# Patient Record
Sex: Female | Born: 1980 | Race: Black or African American | Hispanic: No | Marital: Single | Smoking: Current some day smoker
Health system: Southern US, Community
[De-identification: ages and names within clinical notes are randomized; demographics above are authoritative.]

## PROBLEM LIST (undated history)

## (undated) HISTORY — PX: OTHER SURGICAL HISTORY: SHX169

## (undated) HISTORY — PX: WISDOM TOOTH EXTRACTION: SHX21

---

## 1998-02-16 ENCOUNTER — Emergency Department (HOSPITAL_COMMUNITY): Admission: EM | Admit: 1998-02-16 | Discharge: 1998-02-16 | Payer: Self-pay | Admitting: Emergency Medicine

## 1999-01-24 ENCOUNTER — Emergency Department (HOSPITAL_COMMUNITY): Admission: EM | Admit: 1999-01-24 | Discharge: 1999-01-24 | Payer: Self-pay | Admitting: Emergency Medicine

## 1999-11-25 ENCOUNTER — Ambulatory Visit (HOSPITAL_COMMUNITY): Admission: RE | Admit: 1999-11-25 | Discharge: 1999-11-25 | Payer: Self-pay | Admitting: Dentistry

## 1999-11-25 ENCOUNTER — Encounter: Payer: Self-pay | Admitting: Dentistry

## 2000-04-10 ENCOUNTER — Encounter: Payer: Self-pay | Admitting: Emergency Medicine

## 2000-04-10 ENCOUNTER — Emergency Department (HOSPITAL_COMMUNITY): Admission: EM | Admit: 2000-04-10 | Discharge: 2000-04-10 | Payer: Self-pay | Admitting: Emergency Medicine

## 2000-05-04 ENCOUNTER — Emergency Department (HOSPITAL_COMMUNITY): Admission: EM | Admit: 2000-05-04 | Discharge: 2000-05-04 | Payer: Self-pay | Admitting: Emergency Medicine

## 2000-06-02 ENCOUNTER — Encounter: Payer: Self-pay | Admitting: Nephrology

## 2000-06-02 ENCOUNTER — Encounter: Admission: RE | Admit: 2000-06-02 | Discharge: 2000-06-02 | Payer: Self-pay | Admitting: Nephrology

## 2000-06-04 ENCOUNTER — Encounter: Payer: Self-pay | Admitting: Nephrology

## 2000-06-04 ENCOUNTER — Encounter: Admission: RE | Admit: 2000-06-04 | Discharge: 2000-06-04 | Payer: Self-pay | Admitting: Nephrology

## 2001-04-12 ENCOUNTER — Other Ambulatory Visit: Admission: RE | Admit: 2001-04-12 | Discharge: 2001-04-12 | Payer: Self-pay | Admitting: Obstetrics and Gynecology

## 2001-09-28 ENCOUNTER — Emergency Department (HOSPITAL_COMMUNITY): Admission: EM | Admit: 2001-09-28 | Discharge: 2001-09-28 | Payer: Self-pay | Admitting: Emergency Medicine

## 2002-06-08 ENCOUNTER — Other Ambulatory Visit: Admission: RE | Admit: 2002-06-08 | Discharge: 2002-06-08 | Payer: Self-pay | Admitting: Obstetrics and Gynecology

## 2002-09-22 ENCOUNTER — Emergency Department (HOSPITAL_COMMUNITY): Admission: EM | Admit: 2002-09-22 | Discharge: 2002-09-23 | Payer: Self-pay | Admitting: *Deleted

## 2002-12-03 ENCOUNTER — Inpatient Hospital Stay (HOSPITAL_COMMUNITY): Admission: AD | Admit: 2002-12-03 | Discharge: 2002-12-03 | Payer: Self-pay | Admitting: Obstetrics and Gynecology

## 2003-04-17 ENCOUNTER — Inpatient Hospital Stay (HOSPITAL_COMMUNITY): Admission: AD | Admit: 2003-04-17 | Discharge: 2003-04-17 | Payer: Self-pay | Admitting: Obstetrics and Gynecology

## 2003-06-11 ENCOUNTER — Other Ambulatory Visit: Admission: RE | Admit: 2003-06-11 | Discharge: 2003-06-11 | Payer: Self-pay | Admitting: Obstetrics and Gynecology

## 2004-06-08 ENCOUNTER — Inpatient Hospital Stay (HOSPITAL_COMMUNITY): Admission: AD | Admit: 2004-06-08 | Discharge: 2004-06-08 | Payer: Self-pay | Admitting: Obstetrics and Gynecology

## 2004-06-24 ENCOUNTER — Other Ambulatory Visit: Admission: RE | Admit: 2004-06-24 | Discharge: 2004-06-24 | Payer: Self-pay | Admitting: Obstetrics and Gynecology

## 2005-02-12 ENCOUNTER — Ambulatory Visit: Payer: Self-pay | Admitting: Internal Medicine

## 2005-09-15 ENCOUNTER — Other Ambulatory Visit: Admission: RE | Admit: 2005-09-15 | Discharge: 2005-09-15 | Payer: Self-pay | Admitting: Obstetrics and Gynecology

## 2006-06-08 ENCOUNTER — Ambulatory Visit (HOSPITAL_COMMUNITY): Admission: RE | Admit: 2006-06-08 | Discharge: 2006-06-08 | Payer: Self-pay | Admitting: Family Medicine

## 2007-05-10 ENCOUNTER — Ambulatory Visit (HOSPITAL_BASED_OUTPATIENT_CLINIC_OR_DEPARTMENT_OTHER): Admission: RE | Admit: 2007-05-10 | Discharge: 2007-05-10 | Payer: Self-pay | Admitting: General Surgery

## 2007-05-10 ENCOUNTER — Encounter (HOSPITAL_BASED_OUTPATIENT_CLINIC_OR_DEPARTMENT_OTHER): Payer: Self-pay | Admitting: General Surgery

## 2008-02-15 ENCOUNTER — Encounter: Admission: RE | Admit: 2008-02-15 | Discharge: 2008-02-15 | Payer: Self-pay | Admitting: General Practice

## 2008-02-23 ENCOUNTER — Ambulatory Visit (HOSPITAL_COMMUNITY): Admission: RE | Admit: 2008-02-23 | Discharge: 2008-02-23 | Payer: Self-pay | Admitting: Chiropractic Medicine

## 2008-02-24 ENCOUNTER — Encounter: Admission: RE | Admit: 2008-02-24 | Discharge: 2008-02-24 | Payer: Self-pay | Admitting: Chiropractic Medicine

## 2008-03-19 ENCOUNTER — Emergency Department (HOSPITAL_COMMUNITY): Admission: EM | Admit: 2008-03-19 | Discharge: 2008-03-19 | Payer: Self-pay | Admitting: Emergency Medicine

## 2008-08-30 ENCOUNTER — Emergency Department (HOSPITAL_COMMUNITY): Admission: EM | Admit: 2008-08-30 | Discharge: 2008-08-30 | Payer: Self-pay | Admitting: Emergency Medicine

## 2009-03-18 ENCOUNTER — Ambulatory Visit (HOSPITAL_COMMUNITY): Admission: RE | Admit: 2009-03-18 | Discharge: 2009-03-18 | Payer: Self-pay | Admitting: Obstetrics & Gynecology

## 2009-06-12 ENCOUNTER — Observation Stay (HOSPITAL_COMMUNITY): Admission: AD | Admit: 2009-06-12 | Discharge: 2009-06-13 | Payer: Self-pay | Admitting: Obstetrics and Gynecology

## 2009-06-12 ENCOUNTER — Ambulatory Visit: Payer: Self-pay | Admitting: Family Medicine

## 2009-07-28 ENCOUNTER — Inpatient Hospital Stay (HOSPITAL_COMMUNITY): Admission: AD | Admit: 2009-07-28 | Discharge: 2009-07-31 | Payer: Self-pay | Admitting: Obstetrics & Gynecology

## 2009-07-28 ENCOUNTER — Ambulatory Visit: Payer: Self-pay | Admitting: Obstetrics & Gynecology

## 2009-11-10 ENCOUNTER — Emergency Department (HOSPITAL_COMMUNITY): Admission: EM | Admit: 2009-11-10 | Discharge: 2009-11-10 | Payer: Self-pay | Admitting: Emergency Medicine
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
## 2010-06-04 ENCOUNTER — Emergency Department (HOSPITAL_COMMUNITY): Admission: EM | Admit: 2010-06-04 | Discharge: 2010-06-04 | Payer: Self-pay | Admitting: Emergency Medicine

## 2010-06-18 ENCOUNTER — Inpatient Hospital Stay (HOSPITAL_COMMUNITY): Admission: AD | Admit: 2010-06-18 | Discharge: 2010-06-18 | Payer: Self-pay | Admitting: Obstetrics and Gynecology

## 2010-08-10 HISTORY — PX: TUBAL LIGATION: SHX77

## 2010-10-21 ENCOUNTER — Inpatient Hospital Stay (HOSPITAL_COMMUNITY)
Admission: AD | Admit: 2010-10-21 | Discharge: 2010-10-21 | Disposition: A | Discharge status: Home / Self Care | Payer: Medicaid Other | Source: Ambulatory Visit | Attending: Obstetrics and Gynecology | Admitting: Obstetrics and Gynecology

## 2010-10-21 DIAGNOSIS — Z2989 Encounter for other specified prophylactic measures: Secondary | ICD-10-CM | POA: Insufficient documentation

## 2010-10-21 DIAGNOSIS — Z298 Encounter for other specified prophylactic measures: Secondary | ICD-10-CM | POA: Insufficient documentation

## 2010-10-21 DIAGNOSIS — Z3480 Encounter for supervision of other normal pregnancy, unspecified trimester: Secondary | ICD-10-CM | POA: Insufficient documentation

## 2010-10-21 LAB — RH IMMUNE GLOBULIN WORKUP (NOT WOMEN'S HOSP)

## 2010-10-22 LAB — RH IMMUNE GLOBULIN WORKUP (NOT WOMEN'S HOSP)
ABO/RH(D): A NEG
Antibody Screen: NEGATIVE
Unit division: 0

## 2010-11-10 LAB — CBC
HCT: 28.4 % — ABNORMAL LOW (ref 36.0–46.0)
HCT: 33.8 % — ABNORMAL LOW (ref 36.0–46.0)
Hemoglobin: 11.3 g/dL — ABNORMAL LOW (ref 12.0–15.0)
Hemoglobin: 9.6 g/dL — ABNORMAL LOW (ref 12.0–15.0)
MCHC: 33.7 g/dL (ref 30.0–36.0)
MCV: 98.9 fL (ref 78.0–100.0)
RBC: 2.87 MIL/uL — ABNORMAL LOW (ref 3.87–5.11)
RBC: 3.43 MIL/uL — ABNORMAL LOW (ref 3.87–5.11)
RDW: 13.2 % (ref 11.5–15.5)

## 2010-11-10 LAB — RH IMMUNE GLOB WKUP(>/=20WKS)(NOT WOMEN'S HOSP)

## 2010-11-12 LAB — BASIC METABOLIC PANEL
BUN: 3 mg/dL — ABNORMAL LOW (ref 6–23)
CO2: 24 mEq/L (ref 19–32)
Calcium: 8.5 mg/dL (ref 8.4–10.5)
Chloride: 111 mEq/L (ref 96–112)
Creatinine, Ser: 0.55 mg/dL (ref 0.4–1.2)
GFR calc Af Amer: >60 mL/min (ref >60)

## 2010-11-12 LAB — COMPREHENSIVE METABOLIC PANEL
ALT: 21 U/L (ref 0–35)
AST: 26 U/L (ref 0–37)
Alkaline Phosphatase: 119 U/L — ABNORMAL HIGH (ref 39–117)
CO2: 22 mEq/L (ref 19–32)
GFR calc Af Amer: >60 mL/min (ref >60)
GFR calc non Af Amer: >60 mL/min (ref >60)
Glucose, Bld: 105 mg/dL — ABNORMAL HIGH (ref 70–99)
Potassium: 3.3 mEq/L — ABNORMAL LOW (ref 3.5–5.1)
Sodium: 134 mEq/L — ABNORMAL LOW (ref 135–145)

## 2010-11-12 LAB — DIFFERENTIAL
Basophils Relative: 1 % (ref 0–1)
Eosinophils Absolute: 0.3 10*3/uL (ref 0.0–0.7)
Eosinophils Relative: 3 % (ref 0–5)
Monocytes Relative: 6 % (ref 3–12)
Neutrophils Relative %: 73 % (ref 43–77)

## 2010-11-12 LAB — URINALYSIS, ROUTINE W REFLEX MICROSCOPIC
Glucose, UA: NEGATIVE mg/dL
Hgb urine dipstick: NEGATIVE
Ketones, ur: 15 mg/dL — AB
Protein, ur: NEGATIVE mg/dL
Urobilinogen, UA: 0.2 mg/dL (ref 0.0–1.0)

## 2010-11-12 LAB — CBC
Hemoglobin: 9.3 g/dL — ABNORMAL LOW (ref 12.0–15.0)
RBC: 2.77 MIL/uL — ABNORMAL LOW (ref 3.87–5.11)
WBC: 10.8 10*3/uL — ABNORMAL HIGH (ref 4.0–10.5)

## 2010-12-23 NOTE — Op Note (Signed)
NAME:  KIMBERL, VIG NO.:  [id number]   MEDICAL RECORD NO.:  [id number]          PATIENT TYPE:  AMB   LOCATION:  DSC                          FACILITY:  [facility]   PHYSICIAN:  Leonie Man, M.D.   DATE OF BIRTH:  24-May-1981   DATE OF PROCEDURE:  05/10/2007  DATE OF DISCHARGE:                               OPERATIVE REPORT   PREOPERATIVE DIAGNOSIS:  Anal polyp and hemorrhoids.   POSTOPERATIVE DIAGNOSIS:  Anal polyp and hemorrhoids.   PROCEDURE:  Excision of the anal polyp and hemorrhoidectomy.   SURGEON:  Leonie Man, M.D.   ASSISTANT:  OR nurse.   ANESTHESIA:  General.   SPECIMENS TO LAB:  Polyp with hemorrhoids.   ESTIMATED BLOOD LOSS:  Minimal.   COMPLICATIONS:  None.  The patient returned to the PACU in excellent  condition.   NOTE:  The patient is a 30 year old female presenting with an anal  polyp.  It has been increasing in size over the past 3-4 years and the  patient thought at first it was a hemorrhoid but it did not respond to  the usual nonsurgical therapies.  This was seen recently by her  gynecologist and noted to be a polyp and thus she was then referred for  surgical treatment.  The patient comes to the operating room now after  risks and potential benefits of surgery had been discussed.  All  questions answered and consent obtained.   PROCEDURE:  The patient is positioned in the lithotomy position.  Following induction of satisfactory general anesthesia, the perianal  tissues were prepped and draped to be included in a sterile operative  field.  Positive identification of the patient carried out.  The patient  is Virginia Perez and the procedure excision of anal polyp and  hemorrhoidectomy.  The perianal tissues are then infiltrated with the  0.25% Marcaine with epinephrine and anal dilatation carried out to two  fingerbreadths.  The operating anoscope was introduced into the anus.  There was noted a prolapse hemorrhoidal complex  along with a polyp on  the hemorrhoid itself.  This was excised using electrocautery by  carrying an incision from the dentate line down across the mucocutaneous  junction on both sides in an ellipse.  This was dissected free from off  of the external sphincter and removed in its entirety.  Hemostasis was  obtained with electrocautery.  Specimen was forwarded for pathologic  evaluation.  The mucosa and anoderm closed with a running suture of 2-0  chromic catgut.  All areas dissection checked for hemostasis, noted to  be dry.  I placed a  small Gelfoam soaked sponge in Marcaine over the incision line and left  that in place.  Sterile dressing was applied.  The anesthetic reversed  and the patient removed from the operating room to the recovery room in  stable condition.  She tolerated the procedure well.      Leonie Man, M.D.  Electronically Signed     PB/MEDQ  D:  05/10/2007  T:  05/10/2007  Job:  [id number]   cc:   Reuben Likes, M.D.  Elmira J. Powell, P.A.

## 2011-01-11 ENCOUNTER — Inpatient Hospital Stay (HOSPITAL_COMMUNITY)
Admission: AD | Admit: 2011-01-11 | Discharge: 2011-01-13 | DRG: 767 | Disposition: A | Discharge status: Home / Self Care | Payer: 59 | Source: Ambulatory Visit | Attending: Obstetrics and Gynecology | Admitting: Obstetrics and Gynecology

## 2011-01-11 DIAGNOSIS — O34219 Maternal care for unspecified type scar from previous cesarean delivery: Secondary | ICD-10-CM | POA: Diagnosis present

## 2011-01-11 DIAGNOSIS — Z302 Encounter for sterilization: Secondary | ICD-10-CM

## 2011-01-11 LAB — CBC
HCT: 33.1 % — ABNORMAL LOW (ref 36.0–46.0)
MCH: 32.8 pg (ref 26.0–34.0)
MCHC: 34.4 g/dL (ref 30.0–36.0)
MCV: 95.1 fL (ref 78.0–100.0)
Platelets: 173 10*3/uL (ref 150–400)
RDW: 13.3 % (ref 11.5–15.5)
WBC: 11.6 10*3/uL — ABNORMAL HIGH (ref 4.0–10.5)

## 2011-01-12 ENCOUNTER — Other Ambulatory Visit: Payer: Self-pay | Admitting: Obstetrics and Gynecology

## 2011-01-12 LAB — CBC
MCH: 32.4 pg (ref 26.0–34.0)
MCHC: 34 g/dL (ref 30.0–36.0)
MCV: 95.2 fL (ref 78.0–100.0)
Platelets: 166 10*3/uL (ref 150–400)
RBC: 3.12 MIL/uL — ABNORMAL LOW (ref 3.87–5.11)
RDW: 13.3 % (ref 11.5–15.5)

## 2011-01-14 LAB — RH IMMUNE GLOB WKUP(>/=20WKS)(NOT WOMEN'S HOSP)

## 2011-01-15 NOTE — Op Note (Signed)
  NAME:  Virginia Perez, HOOSER NO.:  [id number]  MEDICAL RECORD NO.:  [id number]  LOCATION:  9143                          FACILITY:  [facility]  PHYSICIAN:  Crist Fat. Kathelyn Gombos, M.D. DATE OF BIRTH:  [birth year]  DATE OF PROCEDURE:  01/12/2011 DATE OF DISCHARGE:                              OPERATIVE REPORT   PREOPERATIVE DIAGNOSIS:  Desire for sterilization.  POSTOPERATIVE DIAGNOSIS:  Desire for sterilization.  ANESTHESIA:  Epidural, Dr. Cristela Blue.  PROCEDURE:  Postpartum bilateral tubal ligation.  SURGEON:  Crist Fat. Avalina Benko, MD  ASSISTANT:  Elmira J. Lowell Guitar, PA  ESTIMATED BLOOD LOSS:  Minimal.  PROCEDURE IN DETAIL:  After being informed of the planned procedure with possible complications including bleeding, infection, injury to other organs, irreversibility, and failure rate of 1/500, informed consent was obtained.  The patient was taken to OR #7, given epidural anesthesia with the previously placed epidural catheter without complication.  She was placed in the dorsal decubitus position, prepped and draped in a sterile fashion, and a Foley catheter was inserted in her bladder. After assessing adequate level of anesthesia, we infiltrated the umbilical area with 10 mL of Marcaine 0.25 and performed a semi- elliptical incision which was brought down sharply to the fascia.  The fascia was grasped with 2 Kocher forceps, incised with Mayo scissors, and peritoneum was entered bluntly.  We locate the left tube initially and gravid with a Babcock forceps, exteriorized the fimbriae to confirm tube.  We then at the isthmic-ampullary area, opened the mesosalpinx with cauterization, doubly ligated the proximal stump with 0-chromic, doubly ligated the distal stump with 0-chromic, removed 1.5 cm of tube and cauterized each stump.  Hemostasis was adequate.  We proceeded in the exact same fashion on the right.  Hemostasis was also adequate.  We then closed the fascia with a running  suture of 0-Vicryl and the skin with a subcuticular suture of 4-0 Monocryl and Dermabond.  Instrument and sponge count was complete x2.  Estimated blood loss was minimal.  The procedure was well tolerated by the patient who was taken to the recovery room in a well and stable condition.  SPECIMEN:  Two segments of tubes sent to pathology.     Crist Fat Jayleen Scaglione, M.D.     SAR/MEDQ  D:  01/12/2011  T:  01/13/2011  Job:  [id number]  Electronically Signed by Silverio Lay M.D. on 01/15/2011 11:26:35 PM

## 2011-01-21 ENCOUNTER — Inpatient Hospital Stay (HOSPITAL_COMMUNITY)
Admission: AD | Admit: 2011-01-21 | Discharge: 2011-01-21 | Disposition: A | Discharge status: Home / Self Care | Payer: 59 | Source: Ambulatory Visit | Attending: Obstetrics and Gynecology | Admitting: Obstetrics and Gynecology

## 2011-01-21 DIAGNOSIS — O135 Gestational [pregnancy-induced] hypertension without significant proteinuria, complicating the puerperium: Secondary | ICD-10-CM | POA: Insufficient documentation

## 2011-01-21 LAB — COMPREHENSIVE METABOLIC PANEL
AST: 18 U/L (ref 0–37)
Albumin: 2.7 g/dL — ABNORMAL LOW (ref 3.5–5.2)
Alkaline Phosphatase: 103 U/L (ref 39–117)
BUN: 12 mg/dL (ref 6–23)
CO2: 22 mEq/L (ref 19–32)
Chloride: 104 mEq/L (ref 96–112)
Creatinine, Ser: 0.75 mg/dL (ref 0.4–1.2)
GFR calc non Af Amer: >60 mL/min (ref >60)
Potassium: 3.6 mEq/L (ref 3.5–5.1)
Total Bilirubin: 0.2 mg/dL — ABNORMAL LOW (ref 0.3–1.2)

## 2011-01-21 LAB — CBC
HCT: 29.8 % — ABNORMAL LOW (ref 36.0–46.0)
MCV: 94.3 fL (ref 78.0–100.0)
RBC: 3.16 MIL/uL — ABNORMAL LOW (ref 3.87–5.11)
RDW: 12.6 % (ref 11.5–15.5)
WBC: 5.4 10*3/uL (ref 4.0–10.5)

## 2011-01-21 LAB — URINALYSIS, ROUTINE W REFLEX MICROSCOPIC
Bilirubin Urine: NEGATIVE
Glucose, UA: NEGATIVE mg/dL
Protein, ur: NEGATIVE mg/dL
Specific Gravity, Urine: 1.01 (ref 1.005–1.030)

## 2011-01-21 LAB — LACTATE DEHYDROGENASE: LDH: 180 U/L (ref 94–250)

## 2011-01-21 LAB — URINE MICROSCOPIC-ADD ON: WBC, UA: NONE SEEN WBC/hpf (ref <3)

## 2011-05-08 LAB — DIFFERENTIAL
Eosinophils Relative: 2
Lymphocytes Relative: 34
Lymphs Abs: 1.6
Monocytes Absolute: 0.3
Monocytes Relative: 7

## 2011-05-08 LAB — COMPREHENSIVE METABOLIC PANEL
BUN: 5 — ABNORMAL LOW
Calcium: 9.9
Glucose, Bld: 94
Total Protein: 7.8

## 2011-05-08 LAB — CBC
HCT: 38.9
Hemoglobin: 13.1
RBC: 4.1
WBC: 4.6

## 2011-05-08 LAB — URINALYSIS, ROUTINE W REFLEX MICROSCOPIC
Glucose, UA: NEGATIVE
Ketones, ur: 15 — AB
pH: 6

## 2011-05-08 LAB — POCT PREGNANCY, URINE: Preg Test, Ur: NEGATIVE

## 2011-05-08 LAB — HCG, SERUM, QUALITATIVE: Preg, Serum: NEGATIVE

## 2011-05-08 LAB — LIPASE, BLOOD: Lipase: 18

## 2011-12-18 ENCOUNTER — Encounter (HOSPITAL_COMMUNITY): Payer: Self-pay | Admitting: *Deleted

## 2011-12-18 ENCOUNTER — Emergency Department (HOSPITAL_COMMUNITY)
Admission: EM | Admit: 2011-12-18 | Discharge: 2011-12-18 | Disposition: A | Discharge status: Home / Self Care | Payer: BC Managed Care – PPO | Attending: Emergency Medicine | Admitting: Emergency Medicine

## 2011-12-18 DIAGNOSIS — M7989 Other specified soft tissue disorders: Secondary | ICD-10-CM | POA: Insufficient documentation

## 2011-12-18 DIAGNOSIS — M5430 Sciatica, unspecified side: Secondary | ICD-10-CM | POA: Insufficient documentation

## 2011-12-18 DIAGNOSIS — M549 Dorsalgia, unspecified: Secondary | ICD-10-CM | POA: Insufficient documentation

## 2011-12-18 MED ORDER — CYCLOBENZAPRINE HCL 10 MG PO TABS: 10.0000 mg | ORAL_TABLET | Freq: Two times a day (BID) | ORAL | Status: AC | PRN

## 2011-12-18 MED ORDER — IBUPROFEN 800 MG PO TABS
800.0000 mg | ORAL_TABLET | Freq: Once | ORAL | Status: AC
  Administered 2011-12-18: 800 mg via ORAL
  Filled 2011-12-18: qty 1

## 2011-12-18 MED ORDER — OXYCODONE-ACETAMINOPHEN 5-325 MG PO TABS: 2.0000 | ORAL_TABLET | ORAL | Status: AC | PRN

## 2011-12-18 NOTE — ED Notes (Signed)
Pt reports left hip pain- denies injury- reports during pregnancy it flared up- Pt reports pain comes and goes and increases when stationary.  Good cms-  Radiating pain to LLE and upper back

## 2011-12-18 NOTE — ED Provider Notes (Signed)
History     CSN: [id number]  Arrival date & time 12/18/11  2027   First MD Initiated Contact with Patient 12/18/11 2142      Chief Complaint  Patient presents with  . Hip Pain    (Consider location/radiation/quality/duration/timing/severity/associated sxs/prior treatment) Patient is a 31 y.o. female presenting with hip pain. The history is provided by the patient. No language interpreter was used.  Hip Pain This is a chronic problem. The current episode started more than 1 year ago. The problem occurs intermittently. The problem has been gradually worsening. Associated symptoms include arthralgias. Pertinent negatives include no chills, fever, joint swelling, nausea, numbness, vomiting or weakness. The symptoms are aggravated by walking. She has tried NSAIDs for the symptoms.   patient reports ongoing chronic back pain and sciatica since her child was born several years ago. Denies any bowel or bladder incontinence. Denies any fever. Denies any good range of motion to the knee he left lower extremity. States that the pain has come and gone over the years and she has seen a orthopedic doctor in the past.  History reviewed. No pertinent past medical history.  Past Surgical History  Procedure Date  . Wisdom tooth extraction     History reviewed. No pertinent family history.  History  Substance Use Topics  . Smoking status: Never Smoker   . Smokeless tobacco: Not on file  . Alcohol Use: No    OB History    Grav Para Term Preterm Abortions TAB SAB Ect Mult Living                  Review of Systems  Constitutional: Negative.  Negative for fever and chills.  HENT: Negative.   Eyes: Negative.   Respiratory: Negative.   Cardiovascular: Positive for leg swelling.  Gastrointestinal: Negative.  Negative for nausea and vomiting.  Genitourinary: Negative for dysuria, urgency, frequency, hematuria, flank pain, decreased urine volume, vaginal discharge, enuresis and difficulty  urinating.  Musculoskeletal: Positive for back pain and arthralgias. Negative for joint swelling and gait problem.       L buttocks pain with radiation to LLE posteriorly  Neurological: Negative.  Negative for dizziness, weakness, light-headedness and numbness.  Psychiatric/Behavioral: Negative.   All other systems reviewed and are negative.    Allergies  Prednisone  Home Medications   Current Outpatient Rx  Name Route Sig Dispense Refill  . ACETAMINOPHEN 500 MG PO TABS Oral Take 500 mg by mouth every 6 (six) hours as needed. For pain relief    . OMEGA-3 FATTY ACIDS 1000 MG PO CAPS Oral Take 2 g by mouth daily.    . IBUPROFEN 400 MG PO TABS Oral Take 400 mg by mouth every 6 (six) hours as needed. For pain relief    . ADULT MULTIVITAMIN W/MINERALS CH Oral Take 1 tablet by mouth daily.      BP 130/79  Pulse 86  Temp(Src) 98.6 F (37 C) (Oral)  Resp 18  SpO2 100%  LMP 12/01/2011  Physical Exam  Nursing note and vitals reviewed. Constitutional: She is oriented to person, place, and time. She appears well-developed and well-nourished.  HENT:  Head: Normocephalic and atraumatic.  Eyes: Conjunctivae and EOM are normal. Pupils are equal, round, and reactive to light.  Neck: Normal range of motion. Neck supple.  Cardiovascular: Normal rate.   Pulmonary/Chest: Effort normal.  Abdominal: Soft.  Musculoskeletal: Normal range of motion. She exhibits tenderness.       Tenderness to L buttocks and posterior LLE  Neurological: She is alert and oriented to person, place, and time. She has normal reflexes.  Skin: Skin is warm and dry.  Psychiatric: She has a normal mood and affect.    ED Course  Procedures (including critical care time)  Labs Reviewed - No data to display No results found.   No diagnosis found.    MDM  LL back pain with sciatica that is chronic.  Will treat with ibuprofen, ice and percocet.  Allergic to prednisone.  Follow up with ortho if not better next  week. Neuro in tact.         Remi Haggard, NP 12/19/11 2101

## 2011-12-18 NOTE — ED Notes (Signed)
Pt c/o ongoing L hip pain x 3 months. Pt has small children and has hx of L sciata per her report.

## 2011-12-18 NOTE — Discharge Instructions (Signed)
Ms Bryden left hip pain and are describing sounds like you have sciatica going down into your left lower extremity. Take 800 of ibuprofen every 6 hours with food x24 hours and use ice to the area. Take Percocet for severe pain but do not drive with this medication. Return to the ER if he incontinence and bowel or bladder or weakness or numbness to your lower extremity.  Back Pain, Adult Back pain is very common. The pain often gets better over time. The cause of back pain is usually not dangerous. Most people can learn to manage their back pain on their own.  HOME CARE   Stay active. Start with short walks on flat ground if you can. Try to walk farther each day.   Do not sit, drive, or stand in one place for more than 30 minutes. Do not stay in bed.   Do not avoid exercise or work. Activity can help your back heal faster.   Be careful when you bend or lift an object. Bend at your knees, keep the object close to you, and do not twist.   Sleep on a firm mattress. Lie on your side, and bend your knees. If you lie on your back, put a pillow under your knees.   Only take medicines as told by your doctor.   Put ice on the injured area.   Put ice in a plastic bag.   Place a towel between your skin and the bag.   Leave the ice on for 15 to 20 minutes, 3 to 4 times a day for the first 2 to 3 days. After that, you can switch between ice and heat packs.   Ask your doctor about back exercises or massage.   Avoid feeling anxious or stressed. Find good ways to deal with stress, such as exercise.  GET HELP RIGHT AWAY IF:   Your pain does not go away with rest or medicine.   Your pain does not go away in 1 week.   You have new problems.   You do not feel well.   The pain spreads into your legs.   You cannot control when you poop (bowel movement) or pee (urinate).   Your arms or legs feel weak or lose feeling (numbness).   You feel sick to your stomach (nauseous) or throw up (vomit).    You have belly (abdominal) pain.   You feel like you may pass out (faint).  MAKE SURE YOU:   Understand these instructions.   Will watch your condition.   Will get help right away if you are not doing well or get worse.  Document Released: 01/13/2008 Document Revised: 07/16/2011 Document Reviewed: 12/15/2010 [facility] Patient Information 2012 [place], [place].Back Pain, Adult Back pain is very common. The pain often gets better over time. The cause of back pain is usually not dangerous. Most people can learn to manage their back pain on their own.  HOME CARE   Stay active. Start with short walks on flat ground if you can. Try to walk farther each day.   Do not sit, drive, or stand in one place for more than 30 minutes. Do not stay in bed.   Do not avoid exercise or work. Activity can help your back heal faster.   Be careful when you bend or lift an object. Bend at your knees, keep the object close to you, and do not twist.   Sleep on a firm mattress. Lie on your side, and bend your  knees. If you lie on your back, put a pillow under your knees.   Only take medicines as told by your doctor.   Put ice on the injured area.   Put ice in a plastic bag.   Place a towel between your skin and the bag.   Leave the ice on for 15 to 20 minutes, 3 to 4 times a day for the first 2 to 3 days. After that, you can switch between ice and heat packs.   Ask your doctor about back exercises or massage.   Avoid feeling anxious or stressed. Find good ways to deal with stress, such as exercise.  GET HELP RIGHT AWAY IF:   Your pain does not go away with rest or medicine.   Your pain does not go away in 1 week.   You have new problems.   You do not feel well.   The pain spreads into your legs.   You cannot control when you poop (bowel movement) or pee (urinate).   Your arms or legs feel weak or lose feeling (numbness).   You feel sick to your stomach (nauseous) or throw up (vomit).    You have belly (abdominal) pain.   You feel like you may pass out (faint).  MAKE SURE YOU:   Understand these instructions.   Will watch your condition.   Will get help right away if you are not doing well or get worse.  Document Released: 01/13/2008 Document Revised: 07/16/2011 Document Reviewed: 12/15/2010 [facility] Patient Information 2012 [place], [place].Back Pain, Adult Back pain is very common. The pain often gets better over time. The cause of back pain is usually not dangerous. Most people can learn to manage their back pain on their own.  HOME CARE   Stay active. Start with short walks on flat ground if you can. Try to walk farther each day.   Do not sit, drive, or stand in one place for more than 30 minutes. Do not stay in bed.   Do not avoid exercise or work. Activity can help your back heal faster.   Be careful when you bend or lift an object. Bend at your knees, keep the object close to you, and do not twist.   Sleep on a firm mattress. Lie on your side, and bend your knees. If you lie on your back, put a pillow under your knees.   Only take medicines as told by your doctor.   Put ice on the injured area.   Put ice in a plastic bag.   Place a towel between your skin and the bag.   Leave the ice on for 15 to 20 minutes, 3 to 4 times a day for the first 2 to 3 days. After that, you can switch between ice and heat packs.   Ask your doctor about back exercises or massage.   Avoid feeling anxious or stressed. Find good ways to deal with stress, such as exercise.  GET HELP RIGHT AWAY IF:   Your pain does not go away with rest or medicine.   Your pain does not go away in 1 week.   You have new problems.   You do not feel well.   The pain spreads into your legs.   You cannot control when you poop (bowel movement) or pee (urinate).   Your arms or legs feel weak or lose feeling (numbness).   You feel sick to your stomach (nauseous) or throw up (vomit).    You  have belly (abdominal) pain.   You feel like you may pass out (faint).  MAKE SURE YOU:   Understand these instructions.   Will watch your condition.   Will get help right away if you are not doing well or get worse.  Document Released: 01/13/2008 Document Revised: 07/16/2011 Document Reviewed: 12/15/2010 [facility] Patient Information 2012 [place], [place].Back Exercises Back exercises help treat and prevent back injuries. The goal is to increase your strength in your belly (abdominal) and back muscles. These exercises can also help with flexibility. Start these exercises when told by your doctor. HOME CARE Back exercises include: Pelvic Tilt.  Lie on your back with your knees bent. Tilt your pelvis until the lower part of your back is against the floor. Hold this position 5 to 10 sec. Repeat this exercise 5 to 10 times.  Knee to Chest.  Pull 1 knee up against your chest and hold for 20 to 30 seconds. Repeat this with the other knee. This may be done with the other leg straight or bent, whichever feels better. Then, pull both knees up against your chest.  Sit-Ups or Curl-Ups.  Bend your knees 90 degrees. Start with tilting your pelvis, and do a partial, slow sit-up. Only lift your upper half 30 to 45 degrees off the floor. Take at least 2 to 3 seonds for each sit-up. Do not do sit-ups with your knees out straight. If partial sit-ups are difficult, simply do the above but with only tightening your belly (abdominal) muscles and holding it as told.  Hip-Lift.  Lie on your back with your knees flexed 90 degrees. Push down with your feet and shoulders as you raise your hips 2 inches off the floor. Hold for 10 seconds, repeat 5 to 10 times.  Back Arches.  Lie on your stomach. Prop yourself up on bent elbows. Slowly press on your hands, causing an arch in your low back. Repeat 3 to 5 times.  Shoulder-Lifts.  Lie face down with arms beside your body. Keep hips and belly pressed to floor  as you slowly lift your head and shoulders off the floor.  Do not overdo your exercises. Be careful in the beginning. Exercises may cause you some mild back discomfort. If the pain lasts for more than 15 minutes, stop the exercises until you see your doctor. Improvement with exercise for back problems is slow.  Document Released: 08/29/2010 Document Revised: 07/16/2011 Document Reviewed: 08/29/2010 [facility] Patient Information 2012 [place], [place].

## 2011-12-20 NOTE — ED Provider Notes (Signed)
Medical screening examination/treatment/procedure(s) were performed by non-physician practitioner and as supervising physician I was immediately available for consultation/collaboration.   Forbes Cellar, MD 12/20/11 4098

## 2012-04-29 ENCOUNTER — Ambulatory Visit (INDEPENDENT_AMBULATORY_CARE_PROVIDER_SITE_OTHER): Payer: BC Managed Care – PPO | Admitting: Obstetrics and Gynecology

## 2012-04-29 ENCOUNTER — Encounter: Payer: Self-pay | Admitting: Obstetrics and Gynecology

## 2012-04-29 VITALS — BP 100/62 | HR 68 | Resp 16 | Ht 67.0 in | Wt 135.0 lb

## 2012-04-29 DIAGNOSIS — Z113 Encounter for screening for infections with a predominantly sexual mode of transmission: Secondary | ICD-10-CM

## 2012-04-29 DIAGNOSIS — Z124 Encounter for screening for malignant neoplasm of cervix: Secondary | ICD-10-CM

## 2012-04-29 NOTE — Progress Notes (Signed)
Contraception BTL Last pap 2011 WNL Last Mammo None Last Colonoscopy 10 years ago WNL Last Dexa Scan None Primary MD [facility] at Home None  C/o pain on left side since delivery in June 2012 with radiation at times  Filed Vitals:   04/29/12 1449  BP: 100/62  Pulse: 68  Resp: 16   ROS: noncontributory  Physical Examination: General appearance - alert, well appearing, and in no distress Neck - supple, no significant adenopathy Chest - clear to auscultation, no wheezes, rales or rhonchi, symmetric air entry Heart - normal rate and regular rhythm Abdomen - soft, nontender, nondistended, no masses or organomegaly Breasts - breasts appear normal, no suspicious masses, no skin or nipple changes or axillary nodes Pelvic - normal external genitalia, vulva, vagina, cervix, uterus and adnexa Back exam - no CVAT Extremities - no edema, redness or tenderness in the calves or thighs  A/P Refer to Neuro Pap today with full STD screen with consent AEX

## 2012-04-30 LAB — HEPATITIS B SURFACE ANTIGEN: Hepatitis B Surface Ag: NEGATIVE

## 2012-04-30 LAB — HEPATITIS C ANTIBODY: HCV Ab: NEGATIVE

## 2012-05-02 LAB — HSV 2 ANTIBODY, IGG: HSV 2 Glycoprotein G Ab, IgG: <0.1 IV

## 2012-05-02 LAB — HSV 1 ANTIBODY, IGG: HSV 1 Glycoprotein G Ab, IgG: 10.93 IV — ABNORMAL HIGH

## 2012-05-03 LAB — PAP IG, CT-NG, RFX HPV ASCU

## 2012-05-04 ENCOUNTER — Telehealth: Payer: Self-pay

## 2012-05-04 NOTE — Telephone Encounter (Signed)
LM  For pt to call back re test results. Virginia Perez A

## 2012-05-05 ENCOUNTER — Telehealth: Payer: Self-pay

## 2012-05-05 NOTE — Telephone Encounter (Signed)
Pt was called and given results of pap smear and std testings. Pt was already aware of +HSV1. Virginia Perez

## 2012-12-05 ENCOUNTER — Encounter (HOSPITAL_COMMUNITY): Payer: Self-pay | Admitting: Emergency Medicine

## 2012-12-05 ENCOUNTER — Emergency Department (HOSPITAL_COMMUNITY)
Admission: EM | Admit: 2012-12-05 | Discharge: 2012-12-05 | Disposition: A | Discharge status: Home / Self Care | Payer: BC Managed Care – PPO | Attending: Emergency Medicine | Admitting: Emergency Medicine

## 2012-12-05 DIAGNOSIS — R531 Weakness: Secondary | ICD-10-CM

## 2012-12-05 DIAGNOSIS — F17200 Nicotine dependence, unspecified, uncomplicated: Secondary | ICD-10-CM | POA: Insufficient documentation

## 2012-12-05 DIAGNOSIS — Z79899 Other long term (current) drug therapy: Secondary | ICD-10-CM | POA: Insufficient documentation

## 2012-12-05 DIAGNOSIS — R5383 Other fatigue: Secondary | ICD-10-CM | POA: Insufficient documentation

## 2012-12-05 DIAGNOSIS — R5381 Other malaise: Secondary | ICD-10-CM | POA: Insufficient documentation

## 2012-12-05 DIAGNOSIS — Z3202 Encounter for pregnancy test, result negative: Secondary | ICD-10-CM | POA: Insufficient documentation

## 2012-12-05 LAB — POCT I-STAT, CHEM 8
Chloride: 104 mEq/L (ref 96–112)
Creatinine, Ser: 0.8 mg/dL (ref 0.50–1.10)
Glucose, Bld: 108 mg/dL — ABNORMAL HIGH (ref 70–99)
HCT: 37 % (ref 36.0–46.0)
Potassium: 4.2 mEq/L (ref 3.5–5.1)

## 2012-12-05 LAB — CBC WITH DIFFERENTIAL/PLATELET
Basophils Absolute: 0 10*3/uL (ref 0.0–0.1)
Eosinophils Absolute: 0.1 10*3/uL (ref 0.0–0.7)
Eosinophils Relative: 2 % (ref 0–5)
MCH: 31.5 pg (ref 26.0–34.0)
MCHC: 34.4 g/dL (ref 30.0–36.0)
MCV: 91.4 fL (ref 78.0–100.0)
Platelets: 227 10*3/uL (ref 150–400)
RDW: 12.7 % (ref 11.5–15.5)

## 2012-12-05 LAB — URINE MICROSCOPIC-ADD ON

## 2012-12-05 LAB — URINALYSIS, ROUTINE W REFLEX MICROSCOPIC
Bilirubin Urine: NEGATIVE
Ketones, ur: NEGATIVE mg/dL
Nitrite: NEGATIVE
Protein, ur: NEGATIVE mg/dL
Specific Gravity, Urine: 1.029 (ref 1.005–1.030)
Urobilinogen, UA: 1 mg/dL (ref 0.0–1.0)

## 2012-12-05 MED ORDER — SODIUM CHLORIDE 0.9 % IV BOLUS (SEPSIS)
1000.0000 mL | Freq: Once | INTRAVENOUS | Status: AC
  Administered 2012-12-05: 1000 mL via INTRAVENOUS

## 2012-12-05 NOTE — ED Notes (Signed)
Pt given and explained discharge instructions; stated understanding; denied questions/concerns; pt stable at time of discharge

## 2012-12-05 NOTE — ED Notes (Signed)
Pt complains of fatigue and "loss of appetite"

## 2012-12-05 NOTE — ED Provider Notes (Signed)
History     CSN: [id number]  Arrival date & time 12/05/12  1126   First MD Initiated Contact with Patient 12/05/12 1411      Chief Complaint  Patient presents with  . Fatigue    (Consider location/radiation/quality/duration/timing/severity/associated sxs/prior treatment) HPI Comments: 32 y.o. Female with no significant medical hx presents today feeling "rundown" and having a loss of appetite over the past 3 weeks. Pt states life stressors such as two small children at home, moving, boyfriend troubles, have caused her to decrease her oral intake leading to some weight loss. Pt states she does not have a primary care doctor and the only medical provider she sees is her OB once a year.   Patient is a 32 y.o. female presenting with weakness.  Weakness Associated symptoms include weakness. Pertinent negatives include no chest pain, diaphoresis, fever, headaches, nausea, neck pain, numbness, rash or vomiting.    History reviewed. No pertinent past medical history.  Past Surgical History  Procedure Laterality Date  . Wisdom tooth extraction    . Cesarean section      No family history on file.  History  Substance Use Topics  . Smoking status: Current Some Day Smoker  . Smokeless tobacco: Not on file  . Alcohol Use: No    OB History   Grav Para Term Preterm Abortions TAB SAB Ect Mult Living                  Review of Systems  Constitutional: Negative for fever and diaphoresis.  HENT: Negative for neck pain and neck stiffness.   Eyes: Negative for visual disturbance.  Respiratory: Negative for apnea, chest tightness and shortness of breath.   Cardiovascular: Negative for chest pain and palpitations.  Gastrointestinal: Negative for nausea, vomiting, diarrhea and constipation.  Genitourinary: Negative for dysuria.  Musculoskeletal: Negative for gait problem.  Skin: Negative for rash.  Neurological: Positive for weakness. Negative for dizziness, light-headedness, numbness  and headaches.    Allergies  Prednisone  Home Medications   Current Outpatient Rx  Name  Route  Sig  Dispense  Refill  . fish oil-omega-3 fatty acids 1000 MG capsule   Oral   Take 2 g by mouth daily.         . Multiple Vitamin (MULTIVITAMIN WITH MINERALS) TABS   Oral   Take 1 tablet by mouth daily.           BP 134/68  Pulse 66  Temp(Src) 98.3 F (36.8 C) (Oral)  Resp 16  SpO2 100%  LMP 12/05/2012  Physical Exam  Nursing note and vitals reviewed. Constitutional: She is oriented to person, place, and time. No distress.  HENT:  Head: Normocephalic and atraumatic.  Eyes: Conjunctivae and EOM are normal.  Neck: Normal range of motion. Neck supple.  No meningeal signs  Cardiovascular: Normal rate, regular rhythm and normal heart sounds.  Exam reveals no gallop and no friction rub.   No murmur heard. Pulmonary/Chest: Effort normal and breath sounds normal. No respiratory distress. She has no wheezes. She has no rales. She exhibits no tenderness.  Abdominal: Soft. Bowel sounds are normal. She exhibits no distension. There is no tenderness. There is no rebound and no guarding.  Musculoskeletal: Normal range of motion. She exhibits no edema and no tenderness.  Neurological: She is alert and oriented to person, place, and time. No cranial nerve deficit.  Skin: Skin is warm and dry. She is not diaphoretic. No erythema.  Psychiatric: She has a normal  mood and affect.    ED Course  Procedures (including critical care time)  Labs Reviewed  URINALYSIS, ROUTINE W REFLEX MICROSCOPIC - Abnormal; Notable for the following:    Hgb urine dipstick TRACE (*)    All other components within normal limits  CBC WITH DIFFERENTIAL - Abnormal; Notable for the following:    WBC 3.7 (*)    RBC 3.62 (*)    Hemoglobin 11.4 (*)    HCT 33.1 (*)    All other components within normal limits  POCT I-STAT, CHEM 8 - Abnormal; Notable for the following:    Glucose, Bld 108 (*)    All other  components within normal limits  TSH  URINE MICROSCOPIC-ADD ON  POCT PREGNANCY, URINE    Date: 12/05/2012  Rate: 55  Rhythm: sinus bradycardia  QRS Axis: normal  Intervals: normal  ST/T Wave abnormalities: normal  Conduction Disutrbances: none  Narrative Interpretation: Normal EKG  Old EKG Reviewed: None available   No results found.   1. Generalized weakness       MDM  Pt describing vague sx of feeling run down, stressed out, and not eating leading to a generalized weakness and possible dehydration. Will get CBC and chem 8 to check for any anemia or electrolyte imbalance. Will give a liter of IVF to replace any fluid depletion. Discussed with pt the importance of outpt follow up and, even given the expense which was her reason for not seeking outpt tx in the first place, that it is important to find a way to cope better with life stress rather than not eating.   PE was unimpressive for any emergent issues. Pt is hemodynamically stable. Labs unremarkable. TSH still pending.  At this time there does not appear to be any evidence of an acute emergency medical condition and the patient appears stable for discharge with appropriate outpatient follow up.Diagnosis was discussed with patient who verbalizes understanding and is agreeable to discharge. Pt case discussed with Dr. Manus Gunning who agrees with plan.    Glade Nurse, PA-C 12/05/12 2044

## 2012-12-06 NOTE — ED Provider Notes (Signed)
Medical screening examination/treatment/procedure(s) were conducted as a shared visit with non-physician practitioner(s) and myself.  I personally evaluated the patient during the encounter  Nonspecific fatigue x 3 weeks. No fever.  Decreased PO intake.  Appears well hydrated, smiling, appropriate. No neuro deficits. Labs with TSH, UA.  Glynn Octave, MD 12/06/12 1047

## 2013-01-28 ENCOUNTER — Encounter (HOSPITAL_COMMUNITY): Payer: Self-pay | Admitting: *Deleted

## 2013-01-28 ENCOUNTER — Emergency Department (HOSPITAL_COMMUNITY)
Admission: EM | Admit: 2013-01-28 | Discharge: 2013-01-28 | Disposition: A | Discharge status: Home / Self Care | Payer: BC Managed Care – PPO | Source: Home / Self Care

## 2013-01-28 DIAGNOSIS — J0100 Acute maxillary sinusitis, unspecified: Secondary | ICD-10-CM

## 2013-01-28 MED ORDER — HYDROCODONE-ACETAMINOPHEN 7.5-325 MG PO TABS: 1.0000 | ORAL_TABLET | ORAL | Status: DC | PRN

## 2013-01-28 MED ORDER — AMOXICILLIN 500 MG PO CAPS: 1000.0000 mg | ORAL_CAPSULE | Freq: Two times a day (BID) | ORAL | Status: DC

## 2013-01-28 MED ORDER — FLUTICASONE PROPIONATE 50 MCG/ACT NA SUSP: 2.0000 | Freq: Every day | NASAL | Status: DC

## 2013-01-28 NOTE — ED Provider Notes (Signed)
History     CSN: [id number]  Arrival date & time 01/28/13  1639   First MD Initiated Contact with Patient 01/28/13 1710      Chief Complaint  Patient presents with  . Facial Pain    (Consider location/radiation/quality/duration/timing/severity/associated sxs/prior treatment) HPI Comments: 32 year old pleasant female presents with right facial pain for 2 weeks. She is complaining of pain primarily along the right nasal maxillary sinus's. Her teeth ache. She denies fever or chills but has minor and occasional PND. Denies earache or sore throat. She has been taking Sudafed PE 20 mg every 4-6 hours when necessary congestion, applying warm compresses and using the fire. This provides  modest relief. The pain prevents her from sleeping well at night and is there constantly during the day.   History reviewed. No pertinent past medical history.  Past Surgical History  Procedure Laterality Date  . Wisdom tooth extraction    . Cesarean section    . Tubal ligation  2012    History reviewed. No pertinent family history.  History  Substance Use Topics  . Smoking status: Current Some Day Smoker -- 0.25 packs/day    Types: Cigarettes  . Smokeless tobacco: Not on file  . Alcohol Use: No    OB History   Grav Para Term Preterm Abortions TAB SAB Ect Mult Living                  Review of Systems  Constitutional: Negative.   HENT: Positive for congestion, postnasal drip and sinus pressure. Negative for hearing loss, ear pain, sore throat, rhinorrhea and neck pain.   Respiratory: Negative.   Gastrointestinal: Negative.   Musculoskeletal: Negative.   Skin: Negative for rash.  Neurological: Negative.     Allergies  Prednisone  Home Medications   Current Outpatient Rx  Name  Route  Sig  Dispense  Refill  . acetaminophen (TYLENOL) 500 MG tablet   Oral   Take 1,000 mg by mouth every 4 (four) hours as needed for pain.         . fish oil-omega-3 fatty acids 1000 MG capsule  Oral   Take 2 g by mouth daily.         . Multiple Vitamin (MULTIVITAMIN WITH MINERALS) TABS   Oral   Take 1 tablet by mouth daily.         . pseudoephedrine (SUDAFED) 30 MG tablet   Oral   Take 60 mg by mouth every 4 (four) hours as needed for congestion.         Marland Kitchen amoxicillin (AMOXIL) 500 MG capsule   Oral   Take 2 capsules (1,000 mg total) by mouth 2 (two) times daily.   40 capsule   0   . fluticasone (FLONASE) 50 MCG/ACT nasal spray   Nasal   Place 2 sprays into the nose daily.   16 g   2   . HYDROcodone-acetaminophen (NORCO) 7.5-325 MG per tablet   Oral   Take 1 tablet by mouth every 4 (four) hours as needed for pain.   15 tablet   0     BP 140/75  Pulse 60  Temp(Src) 98.9 F (37.2 C) (Oral)  Resp 18  SpO2 100%  LMP 01/25/2013  Physical Exam  Nursing note and vitals reviewed. Constitutional: She is oriented to person, place, and time. She appears well-developed and well-nourished. No distress.  HENT:  Head: Normocephalic and atraumatic.  Mouth/Throat: Oropharynx is clear and moist.  Oropharynx with minor PND. Bilateral  TMs are normal. Pressure applied over the maxilla produces discomfort on the right side.  Eyes: Conjunctivae and EOM are normal.  Neck: Normal range of motion. Neck supple.  Cardiovascular: Normal rate.   Pulmonary/Chest: Effort normal.  Lymphadenopathy:    She has no cervical adenopathy.  Neurological: She is alert and oriented to person, place, and time. She exhibits normal muscle tone.  Skin: Skin is warm and dry. No rash noted.    ED Course  Procedures (including critical care time)  Labs Reviewed - No data to display No results found.   1. Sinusitis, acute maxillary       MDM  Symptoms are typical for right nasal maxillary sinusitis. Uncertain as to etiology. This may be infectious versus allergic. For allergy symptoms are quite minimal. She is to continue Sudafed PE 10 mg every 4 hours when necessary pain the  humidifier and warm compresses. We will start fluticasone nasal spray as directed, amoxicillin for 10 days and Norco 7 Mg every 4 hours when necessary pain        Hayden Rasmussen, NP 01/28/13 1728

## 2013-01-28 NOTE — ED Notes (Signed)
C/o facial pain onset 2 weeks with a little congestion and cough.  Waking her at night for the past few nights.  No chills or fever.  No sorethroat or earache.  States her teeth hurt.

## 2013-01-29 NOTE — ED Provider Notes (Signed)
Medical screening examination/treatment/procedure(s) were performed by resident physician or non-physician practitioner and as supervising physician I was immediately available for consultation/collaboration.   Barkley Bruns MD.   Linna Hoff, MD 01/29/13 1116

## 2013-04-22 ENCOUNTER — Encounter (HOSPITAL_COMMUNITY): Payer: Self-pay | Admitting: *Deleted

## 2013-04-22 ENCOUNTER — Emergency Department (HOSPITAL_COMMUNITY)
Admission: EM | Admit: 2013-04-22 | Discharge: 2013-04-22 | Disposition: A | Discharge status: Home / Self Care | Payer: BC Managed Care – PPO | Attending: Emergency Medicine | Admitting: Emergency Medicine

## 2013-04-22 DIAGNOSIS — Y92410 Unspecified street and highway as the place of occurrence of the external cause: Secondary | ICD-10-CM | POA: Insufficient documentation

## 2013-04-22 DIAGNOSIS — Y9389 Activity, other specified: Secondary | ICD-10-CM | POA: Insufficient documentation

## 2013-04-22 DIAGNOSIS — S139XXA Sprain of joints and ligaments of unspecified parts of neck, initial encounter: Secondary | ICD-10-CM | POA: Insufficient documentation

## 2013-04-22 DIAGNOSIS — Z79899 Other long term (current) drug therapy: Secondary | ICD-10-CM | POA: Insufficient documentation

## 2013-04-22 DIAGNOSIS — S161XXA Strain of muscle, fascia and tendon at neck level, initial encounter: Secondary | ICD-10-CM

## 2013-04-22 DIAGNOSIS — F17200 Nicotine dependence, unspecified, uncomplicated: Secondary | ICD-10-CM | POA: Insufficient documentation

## 2013-04-22 MED ORDER — CYCLOBENZAPRINE HCL 5 MG PO TABS: 5.0000 mg | ORAL_TABLET | Freq: Three times a day (TID) | ORAL | Status: DC | PRN

## 2013-04-22 MED ORDER — IBUPROFEN 400 MG PO TABS
600.0000 mg | ORAL_TABLET | Freq: Once | ORAL | Status: AC
  Administered 2013-04-22: 600 mg via ORAL
  Filled 2013-04-22 (×2): qty 1

## 2013-04-22 MED ORDER — IBUPROFEN 600 MG PO TABS: 600.0000 mg | ORAL_TABLET | Freq: Three times a day (TID) | ORAL | Status: DC
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
## 2013-04-22 NOTE — Discharge Instructions (Signed)

## 2015-04-16 ENCOUNTER — Ambulatory Visit (INDEPENDENT_AMBULATORY_CARE_PROVIDER_SITE_OTHER): Payer: Managed Care, Other (non HMO) | Admitting: Family Medicine

## 2015-04-16 ENCOUNTER — Encounter: Payer: Self-pay | Admitting: Family Medicine

## 2015-04-16 VITALS — BP 122/68 | HR 87 | Temp 98.5°F | Resp 16 | Ht 66.0 in | Wt 140.0 lb

## 2015-04-16 DIAGNOSIS — S46812A Strain of other muscles, fascia and tendons at shoulder and upper arm level, left arm, initial encounter: Secondary | ICD-10-CM

## 2015-04-16 DIAGNOSIS — M7581 Other shoulder lesions, right shoulder: Secondary | ICD-10-CM

## 2015-04-16 DIAGNOSIS — M1990 Unspecified osteoarthritis, unspecified site: Secondary | ICD-10-CM | POA: Diagnosis not present

## 2015-04-16 DIAGNOSIS — M6248 Contracture of muscle, other site: Secondary | ICD-10-CM | POA: Diagnosis not present

## 2015-04-16 DIAGNOSIS — L578 Other skin changes due to chronic exposure to nonionizing radiation: Secondary | ICD-10-CM

## 2015-04-16 DIAGNOSIS — M7552 Bursitis of left shoulder: Secondary | ICD-10-CM | POA: Diagnosis not present

## 2015-04-16 DIAGNOSIS — M62838 Other muscle spasm: Secondary | ICD-10-CM

## 2015-04-16 DIAGNOSIS — L309 Dermatitis, unspecified: Secondary | ICD-10-CM | POA: Diagnosis not present

## 2015-04-16 DIAGNOSIS — L598 Other specified disorders of the skin and subcutaneous tissue related to radiation: Secondary | ICD-10-CM

## 2015-04-16 DIAGNOSIS — M19012 Primary osteoarthritis, left shoulder: Secondary | ICD-10-CM

## 2015-04-16 MED ORDER — MELOXICAM 15 MG PO TABS: 15.0000 mg | ORAL_TABLET | Freq: Every day | ORAL | Status: DC

## 2015-04-16 MED ORDER — CYCLOBENZAPRINE HCL 10 MG PO TABS: 5.0000 mg | ORAL_TABLET | Freq: Three times a day (TID) | ORAL | Status: DC | PRN

## 2015-04-16 MED ORDER — TRIAMCINOLONE ACETONIDE 0.1 % EX CREA: 1.0000 "application " | TOPICAL_CREAM | Freq: Two times a day (BID) | CUTANEOUS | Status: DC

## 2015-04-16 NOTE — Patient Instructions (Addendum)
I recommend keeping meloxicam on board daily for the next 4-6 weeks. In the evening, take a muscle relaxant followed by 15 minutes of heat followed by gentle stretching. Try to do the heat and stretching 2 - 3 times a day.  If you are still having pain in 4 to 6 wks, come back to clinic for further eval. RTC immed if symptoms worsen or you develop any other concerning symptoms below. While you are on the meloxicam, do not use with any other otc pain medication other than tylenol/acetaminophen - so no aleve, ibuprofen, motrin, advil, etc.   Impingement Syndrome, Rotator Cuff, Bursitis with Rehab Impingement syndrome is a condition that involves inflammation of the tendons of the rotator cuff and the subacromial bursa, that causes pain in the shoulder. The rotator cuff consists of four tendons and muscles that control much of the shoulder and upper arm function. The subacromial bursa is a fluid filled sac that helps reduce friction between the rotator cuff and one of the bones of the shoulder (acromion). Impingement syndrome is usually an overuse injury that causes swelling of the bursa (bursitis), swelling of the tendon (tendonitis), and/or a tear of the tendon (strain). Strains are classified into three categories. Grade 1 strains cause pain, but the tendon is not lengthened. Grade 2 strains include a lengthened ligament, due to the ligament being stretched or partially ruptured. With grade 2 strains there is still function, although the function may be decreased. Grade 3 strains include a complete tear of the tendon or muscle, and function is usually impaired. SYMPTOMS   Pain around the shoulder, often at the outer portion of the upper arm.  Pain that gets worse with shoulder function, especially when reaching overhead or lifting.  Sometimes, aching when not using the arm.  Pain that wakes you up at night.  Sometimes, tenderness, swelling, warmth, or redness over the affected area.  Loss of  strength.  Limited motion of the shoulder, especially reaching behind the back (to the back pocket or to unhook bra) or across your body.  Crackling sound (crepitation) when moving the arm.  Biceps tendon pain and inflammation (in the front of the shoulder). Worse when bending the elbow or lifting. CAUSES  Impingement syndrome is often an overuse injury, in which chronic (repetitive) motions cause the tendons or bursa to become inflamed. A strain occurs when a force is paced on the tendon or muscle that is greater than it can withstand. Common mechanisms of injury include: Stress from sudden increase in duration, frequency, or intensity of training.  Direct hit (trauma) to the shoulder.  Aging, erosion of the tendon with normal use.  Bony bump on shoulder (acromial spur). RISK INCREASES WITH:  Contact sports (football, wrestling, boxing).  Throwing sports (baseball, tennis, volleyball).  Weightlifting and bodybuilding.  Heavy labor.  Previous injury to the rotator cuff, including impingement.  Poor shoulder strength and flexibility.  Failure to warm up properly before activity.  Inadequate protective equipment.  Old age.  Bony bump on shoulder (acromial spur). PREVENTION   Warm up and stretch properly before activity.  Allow for adequate recovery between workouts.  Maintain physical fitness:  Strength, flexibility, and endurance.  Cardiovascular fitness.  Learn and use proper exercise technique. PROGNOSIS  If treated properly, impingement syndrome usually goes away within 6 weeks. Sometimes surgery is required.  RELATED COMPLICATIONS   Longer healing time if not properly treated, or if not given enough time to heal.  Recurring symptoms, that result in a  chronic condition.  Shoulder stiffness, frozen shoulder, or loss of motion.  Rotator cuff tendon tear.  Recurring symptoms, especially if activity is resumed too soon, with overuse, with a direct blow, or  when using poor technique. TREATMENT  Treatment first involves the use of ice and medicine, to reduce pain and inflammation. The use of strengthening and stretching exercises may help reduce pain with activity. These exercises may be performed at home or with a therapist. If non-surgical treatment is unsuccessful after more than 6 months, surgery may be advised. After surgery and rehabilitation, activity is usually possible in 3 months.  MEDICATION  If pain medicine is needed, nonsteroidal anti-inflammatory medicines (aspirin and ibuprofen), or other minor pain relievers (acetaminophen), are often advised.  Do not take pain medicine for 7 days before surgery.  Prescription pain relievers may be given, if your caregiver thinks they are needed. Use only as directed and only as much as you need.  Corticosteroid injections may be given by your caregiver. These injections should be reserved for the most serious cases, because they may only be given a certain number of times. HEAT AND COLD  Cold treatment (icing) should be applied for 10 to 15 minutes every 2 to 3 hours for inflammation and pain, and immediately after activity that aggravates your symptoms. Use ice packs or an ice massage.  Heat treatment may be used before performing stretching and strengthening activities prescribed by your caregiver, physical therapist, or athletic trainer. Use a heat pack or a warm water soak. SEEK MEDICAL CARE IF:   Symptoms get worse or do not improve in 4 to 6 weeks, despite treatment.  New, unexplained symptoms develop. (Drugs used in treatment may produce side effects.) EXERCISES  RANGE OF MOTION (ROM) AND STRETCHING EXERCISES - Impingement Syndrome (Rotator Cuff  Tendinitis, Bursitis) These exercises may help you when beginning to rehabilitate your injury. Your symptoms may go away with or without further involvement from your physician, physical therapist or athletic trainer. While completing these  exercises, remember:   Restoring tissue flexibility helps normal motion to return to the joints. This allows healthier, less painful movement and activity.  An effective stretch should be held for at least 30 seconds.  A stretch should never be painful. You should only feel a gentle lengthening or release in the stretched tissue. STRETCH - Flexion, Standing  Stand with good posture. With an underhand grip on your right / left hand, and an overhand grip on the opposite hand, grasp a broomstick or cane so that your hands are a little more than shoulder width apart.  Keeping your right / left elbow straight and shoulder muscles relaxed, push the stick with your opposite hand, to raise your right / left arm in front of your body and then overhead. Raise your arm until you feel a stretch in your right / left shoulder, but before you have increased shoulder pain.  Try to avoid shrugging your right / left shoulder as your arm rises, by keeping your shoulder blade tucked down and toward your mid-back spine. Hold for __________ seconds.  Slowly return to the starting position. Repeat __________ times. Complete this exercise __________ times per day. STRETCH - Abduction, Supine  Lie on your back. With an underhand grip on your right / left hand and an overhand grip on the opposite hand, grasp a broomstick or cane so that your hands are a little more than shoulder width apart.  Keeping your right / left elbow straight and your shoulder  muscles relaxed, push the stick with your opposite hand, to raise your right / left arm out to the side of your body and then overhead. Raise your arm until you feel a stretch in your right / left shoulder, but before you have increased shoulder pain.  Try to avoid shrugging your right / left shoulder as your arm rises, by keeping your shoulder blade tucked down and toward your mid-back spine. Hold for __________ seconds.  Slowly return to the starting position. Repeat  __________ times. Complete this exercise __________ times per day. ROM - Flexion, Active-Assisted  Lie on your back. You may bend your knees for comfort.  Grasp a broomstick or cane so your hands are about shoulder width apart. Your right / left hand should grip the end of the stick, so that your hand is positioned "thumbs-up," as if you were about to shake hands.  Using your healthy arm to lead, raise your right / left arm overhead, until you feel a gentle stretch in your shoulder. Hold for __________ seconds.  Use the stick to assist in returning your right / left arm to its starting position. Repeat __________ times. Complete this exercise __________ times per day.  ROM - Internal Rotation, Supine   Lie on your back on a firm surface. Place your right / left elbow about 60 degrees away from your side. Elevate your elbow with a folded towel, so that the elbow and shoulder are the same height.  Using a broomstick or cane and your strong arm, pull your right / left hand toward your body until you feel a gentle stretch, but no increase in your shoulder pain. Keep your shoulder and elbow in place throughout the exercise.  Hold for __________ seconds. Slowly return to the starting position. Repeat __________ times. Complete this exercise __________ times per day. STRETCH - Internal Rotation  Place your right / left hand behind your back, palm up.  Throw a towel or belt over your opposite shoulder. Grasp the towel with your right / left hand.  While keeping an upright posture, gently pull up on the towel, until you feel a stretch in the front of your right / left shoulder.  Avoid shrugging your right / left shoulder as your arm rises, by keeping your shoulder blade tucked down and toward your mid-back spine.  Hold for __________ seconds. Release the stretch, by lowering your healthy hand. Repeat __________ times. Complete this exercise __________ times per day. ROM - Internal Rotation    Using an underhand grip, grasp a stick behind your back with both hands.  While standing upright with good posture, slide the stick up your back until you feel a mild stretch in the front of your shoulder.  Hold for __________ seconds. Slowly return to your starting position. Repeat __________ times. Complete this exercise __________ times per day.  STRETCH - Posterior Shoulder Capsule   Stand or sit with good posture. Grasp your right / left elbow and draw it across your chest, keeping it at the same height as your shoulder.  Pull your elbow, so your upper arm comes in closer to your chest. Pull until you feel a gentle stretch in the back of your shoulder.  Hold for __________ seconds. Repeat __________ times. Complete this exercise __________ times per day. STRENGTHENING EXERCISES - Impingement Syndrome (Rotator Cuff Tendinitis, Bursitis) These exercises may help you when beginning to rehabilitate your injury. They may resolve your symptoms with or without further involvement from your physician, physical  therapist or athletic trainer. While completing these exercises, remember:  Muscles can gain both the endurance and the strength needed for everyday activities through controlled exercises.  Complete these exercises as instructed by your physician, physical therapist or athletic trainer. Increase the resistance and repetitions only as guided.  You may experience muscle soreness or fatigue, but the pain or discomfort you are trying to eliminate should never worsen during these exercises. If this pain does get worse, stop and make sure you are following the directions exactly. If the pain is still present after adjustments, discontinue the exercise until you can discuss the trouble with your clinician.  During your recovery, avoid activity or exercises which involve actions that place your injured hand or elbow above your head or behind your back or head. These positions stress the  tissues which you are trying to heal. STRENGTH - Scapular Depression and Adduction   With good posture, sit on a firm chair. Support your arms in front of you, with pillows, arm rests, or on a table top. Have your elbows in line with the sides of your body.  Gently draw your shoulder blades down and toward your mid-back spine. Gradually increase the tension, without tensing the muscles along the top of your shoulders and the back of your neck.  Hold for __________ seconds. Slowly release the tension and relax your muscles completely before starting the next repetition.  After you have practiced this exercise, remove the arm support and complete the exercise in standing as well as sitting position. Repeat __________ times. Complete this exercise __________ times per day.  STRENGTH - Shoulder Abductors, Isometric  With good posture, stand or sit about 4-6 inches from a wall, with your right / left side facing the wall.  Bend your right / left elbow. Gently press your right / left elbow into the wall. Increase the pressure gradually, until you are pressing as hard as you can, without shrugging your shoulder or increasing any shoulder discomfort.  Hold for __________ seconds.  Release the tension slowly. Relax your shoulder muscles completely before you begin the next repetition. Repeat __________ times. Complete this exercise __________ times per day.  STRENGTH - External Rotators, Isometric  Keep your right / left elbow at your side and bend it 90 degrees.  Step into a door frame so that the outside of your right / left wrist can press against the door frame without your upper arm leaving your side.  Gently press your right / left wrist into the door frame, as if you were trying to swing the back of your hand away from your stomach. Gradually increase the tension, until you are pressing as hard as you can, without shrugging your shoulder or increasing any shoulder discomfort.  Hold for  __________ seconds.  Release the tension slowly. Relax your shoulder muscles completely before you begin the next repetition. Repeat __________ times. Complete this exercise __________ times per day.  STRENGTH - Supraspinatus   Stand or sit with good posture. Grasp a __________ weight, or an exercise band or tubing, so that your hand is "thumbs-up," like you are shaking hands.  Slowly lift your right / left arm in a "V" away from your thigh, diagonally into the space between your side and straight ahead. Lift your hand to shoulder height or as far as you can, without increasing any shoulder pain. At first, many people do not lift their hands above shoulder height.  Avoid shrugging your right / left shoulder as your  arm rises, by keeping your shoulder blade tucked down and toward your mid-back spine.  Hold for __________ seconds. Control the descent of your hand, as you slowly return to your starting position. Repeat __________ times. Complete this exercise __________ times per day.  STRENGTH - External Rotators  Secure a rubber exercise band or tubing to a fixed object (table, pole) so that it is at the same height as your right / left elbow when you are standing or sitting on a firm surface.  Stand or sit so that the secured exercise band is at your uninjured side.  Bend your right / left elbow 90 degrees. Place a folded towel or small pillow under your right / left arm, so that your elbow is a few inches away from your side.  Keeping the tension on the exercise band, pull it away from your body, as if pivoting on your elbow. Be sure to keep your body steady, so that the movement is coming only from your rotating shoulder.  Hold for __________ seconds. Release the tension in a controlled manner, as you return to the starting position. Repeat __________ times. Complete this exercise __________ times per day.  STRENGTH - Internal Rotators   Secure a rubber exercise band or tubing to a  fixed object (table, pole) so that it is at the same height as your right / left elbow when you are standing or sitting on a firm surface.  Stand or sit so that the secured exercise band is at your right / left side.  Bend your elbow 90 degrees. Place a folded towel or small pillow under your right / left arm so that your elbow is a few inches away from your side.  Keeping the tension on the exercise band, pull it across your body, toward your stomach. Be sure to keep your body steady, so that the movement is coming only from your rotating shoulder.  Hold for __________ seconds. Release the tension in a controlled manner, as you return to the starting position. Repeat __________ times. Complete this exercise __________ times per day.  STRENGTH - Scapular Protractors, Standing   Stand arms length away from a wall. Place your hands on the wall, keeping your elbows straight.  Begin by dropping your shoulder blades down and toward your mid-back spine.  To strengthen your protractors, keep your shoulder blades down, but slide them forward on your rib cage. It will feel as if you are lifting the back of your rib cage away from the wall. This is a subtle motion and can be challenging to complete. Ask your caregiver for further instruction, if you are not sure you are doing the exercise correctly.  Hold for __________ seconds. Slowly return to the starting position, resting the muscles completely before starting the next repetition. Repeat __________ times. Complete this exercise __________ times per day. STRENGTH - Scapular Protractors, Supine  Lie on your back on a firm surface. Extend your right / left arm straight into the air while holding a __________ weight in your hand.  Keeping your head and back in place, lift your shoulder off the floor.  Hold for __________ seconds. Slowly return to the starting position, and allow your muscles to relax completely before starting the next  repetition. Repeat __________ times. Complete this exercise __________ times per day. STRENGTH - Scapular Protractors, Quadruped  Get onto your hands and knees, with your shoulders directly over your hands (or as close as you can be, comfortably).  Keeping your  elbows locked, lift the back of your rib cage up into your shoulder blades, so your mid-back rounds out. Keep your neck muscles relaxed.  Hold this position for __________ seconds. Slowly return to the starting position and allow your muscles to relax completely before starting the next repetition. Repeat __________ times. Complete this exercise __________ times per day.  STRENGTH - Scapular Retractors  Secure a rubber exercise band or tubing to a fixed object (table, pole), so that it is at the height of your shoulders when you are either standing, or sitting on a firm armless chair.  With a palm down grip, grasp an end of the band in each hand. Straighten your elbows and lift your hands straight in front of you, at shoulder height. Step back, away from the secured end of the band, until it becomes tense.  Squeezing your shoulder blades together, draw your elbows back toward your sides, as you bend them. Keep your upper arms lifted away from your body throughout the exercise.  Hold for __________ seconds. Slowly ease the tension on the band, as you reverse the directions and return to the starting position. Repeat __________ times. Complete this exercise __________ times per day. STRENGTH - Shoulder Extensors   Secure a rubber exercise band or tubing to a fixed object (table, pole) so that it is at the height of your shoulders when you are either standing, or sitting on a firm armless chair.  With a thumbs-up grip, grasp an end of the band in each hand. Straighten your elbows and lift your hands straight in front of you, at shoulder height. Step back, away from the secured end of the band, until it becomes tense.  Squeezing your  shoulder blades together, pull your hands down to the sides of your thighs. Do not allow your hands to go behind you.  Hold for __________ seconds. Slowly ease the tension on the band, as you reverse the directions and return to the starting position. Repeat __________ times. Complete this exercise __________ times per day.  STRENGTH - Scapular Retractors and External Rotators   Secure a rubber exercise band or tubing to a fixed object (table, pole) so that it is at the height as your shoulders, when you are either standing, or sitting on a firm armless chair.  With a palm down grip, grasp an end of the band in each hand. Bend your elbows 90 degrees and lift your elbows to shoulder height, at your sides. Step back, away from the secured end of the band, until it becomes tense.  Squeezing your shoulder blades together, rotate your shoulders so that your upper arms and elbows remain stationary, but your fists travel upward to head height.  Hold for __________ seconds. Slowly ease the tension on the band, as you reverse the directions and return to the starting position. Repeat __________ times. Complete this exercise __________ times per day.  STRENGTH - Scapular Retractors and External Rotators, Rowing   Secure a rubber exercise band or tubing to a fixed object (table, pole) so that it is at the height of your shoulders, when you are either standing, or sitting on a firm armless chair.  With a palm down grip, grasp an end of the band in each hand. Straighten your elbows and lift your hands straight in front of you, at shoulder height. Step back, away from the secured end of the band, until it becomes tense.  Step 1: Squeeze your shoulder blades together. Bending your elbows, draw your  hands to your chest, as if you are rowing a boat. At the end of this motion, your hands and elbow should be at shoulder height and your elbows should be out to your sides.  Step 2: Rotate your shoulders, to raise  your hands above your head. Your forearms should be vertical and your upper arms should be horizontal.  Hold for __________ seconds. Slowly ease the tension on the band, as you reverse the directions and return to the starting position. Repeat __________ times. Complete this exercise __________ times per day.  STRENGTH - Scapular Depressors  Find a sturdy chair without wheels, such as a dining room chair.  Keeping your feet on the floor, and your hands on the chair arms, lift your bottom up from the seat, and lock your elbows.  Keeping your elbows straight, allow gravity to pull your body weight down. Your shoulders will rise toward your ears.  Raise your body against gravity by drawing your shoulder blades down your back, shortening the distance between your shoulders and ears. Although your feet should always maintain contact with the floor, your feet should progressively support less body weight, as you get stronger.  Hold for __________ seconds. In a controlled and slow manner, lower your body weight to begin the next repetition. Repeat __________ times. Complete this exercise __________ times per day.  Document Released: 07/27/2005 Document Revised: 10/19/2011 Document Reviewed: 11/08/2008 [facility] Patient Information 2015 [place], [place]. This information is not intended to replace advice given to you by your health care provider. Make sure you discuss any questions you have with your health care provider.
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
## 2016-11-19 ENCOUNTER — Emergency Department (HOSPITAL_COMMUNITY)
Admission: EM | Admit: 2016-11-19 | Discharge: 2016-11-19 | Disposition: A | Discharge status: Home / Self Care | Payer: Self-pay | Attending: Emergency Medicine | Admitting: Emergency Medicine

## 2016-11-19 ENCOUNTER — Encounter (HOSPITAL_COMMUNITY): Payer: Self-pay | Admitting: Emergency Medicine

## 2016-11-19 ENCOUNTER — Emergency Department (HOSPITAL_COMMUNITY): Payer: Self-pay

## 2016-11-19 DIAGNOSIS — L02416 Cutaneous abscess of left lower limb: Secondary | ICD-10-CM | POA: Insufficient documentation

## 2016-11-19 DIAGNOSIS — F17210 Nicotine dependence, cigarettes, uncomplicated: Secondary | ICD-10-CM | POA: Insufficient documentation

## 2016-11-19 DIAGNOSIS — L308 Other specified dermatitis: Secondary | ICD-10-CM | POA: Insufficient documentation

## 2016-11-19 DIAGNOSIS — L0291 Cutaneous abscess, unspecified: Secondary | ICD-10-CM

## 2016-11-19 MED ORDER — TRIAMCINOLONE ACETONIDE 0.1 % EX CREA: 1.0000 "application " | TOPICAL_CREAM | Freq: Two times a day (BID) | CUTANEOUS | 2 refills | Status: DC

## 2016-11-19 MED ORDER — DOXYCYCLINE HYCLATE 100 MG PO CAPS: 100.0000 mg | ORAL_CAPSULE | Freq: Two times a day (BID) | ORAL | 0 refills | Status: DC

## 2016-11-19 NOTE — ED Triage Notes (Signed)
Left hand numbness and tingling x 2-3 weeks, radiates up to neck, pain to left neck with movement, decreased ROM of neck with left rotation, popping sensation to left shoulder. Normal strength and grip. No known injury.  Also c/o several open cysts to face, 2 cm diameter hot, red, indurated lesion to right anterior lower leg, opened, excoriated.

## 2016-11-19 NOTE — ED Provider Notes (Signed)
[facility] DEPT Provider Note   CSN: [id number] Arrival date & time: 11/19/16  1719  By signing my name below, I, Dora Sims, attest that this documentation has been prepared under the direction and in the presence of Margarita Mail, PA-C. Electronically Signed: Dora Sims, Scribe. 11/19/2016. 7:49 PM.  History   Chief Complaint Chief Complaint  Patient presents with  . Numbness  . Cyst    The history is provided by the patient. No language interpreter was used.     HPI Comments: Virginia Perez is a right-hand dominant 36 y.o. female who presents to the Emergency Department complaining of intermittent tingling in her left upper arm radiating into her left hand for about two weeks, significantly worse today. She states her tingling is most prominent throughout her left hand. Patient reports it feels like her left arm/left hand are "asleep" when her tingling is present. She states her tingling is most noticeable at night prior to going to bed and is worse with sitting for a prolonged period of time. Patient reports some associated focal weakness of her left upper arm. She has a h/o chronic neck pain which she states is worse since onset of her extremity tingling. She doubts the possibility of pregnancy and has a h/o tubal ligation. No hormone therapy. She denies fevers, chills, or any other associated symptoms.  Patient is also complaining of two moderate areas of pain and swelling for a few days. She reports a "cyst" to her forehead as well as to her anterior right lower leg. Patient endorses discharge from the areas and pain with applied pressure to the areas. She has no other complaints at this time.  History reviewed. No pertinent past medical history.  There are no active problems to display for this patient.   Past Surgical History:  Procedure Laterality Date  . CESAREAN SECTION    . TUBAL LIGATION  2012  . WISDOM TOOTH EXTRACTION      OB History    No data  available       Home Medications    Prior to Admission medications   Medication Sig Start Date End Date Taking? Authorizing Provider  cyclobenzaprine (FLEXERIL) 10 MG tablet Take 0.5-1 tablets (5-10 mg total) by mouth 3 (three) times daily as needed for muscle spasms. Patient not taking: Reported on 11/19/2016 04/16/15   Shawnee Knapp, MD  doxycycline (VIBRAMYCIN) 100 MG capsule Take 1 capsule (100 mg total) by mouth 2 (two) times daily. 11/19/16   Margarita Mail, PA-C  meloxicam (MOBIC) 15 MG tablet Take 1 tablet (15 mg total) by mouth daily. Patient not taking: Reported on 11/19/2016 04/16/15   Shawnee Knapp, MD  triamcinolone cream (KENALOG) 0.1 % Apply 1 application topically 2 (two) times daily. 11/19/16   Margarita Mail, PA-C    Family History History reviewed. No pertinent family history.  Social History Social History  Substance Use Topics  . Smoking status: Current Some Day Smoker    Packs/day: 0.25    Types: Cigarettes  . Smokeless tobacco: Not on file  . Alcohol use No     Allergies   Prednisone   Review of Systems Review of Systems  All other systems reviewed and are negative for acute change except as noted in the HPI.  Physical Exam Updated Vital Signs BP (!) 145/54   Pulse 61   Temp 98.5 F (36.9 C) (Oral)   Resp 16   Ht 5\' 6"  (1.676 m)   Wt 68 kg  SpO2 100%   BMI 24.21 kg/m   Physical Exam  Constitutional: She is oriented to person, place, and time. She appears well-developed and well-nourished. No distress.  HENT:  Head: Normocephalic.  Cystic lesion to forehead that is exquisitely tender and indurated.  Eyes: Conjunctivae and EOM are normal.  Neck: Neck supple. No tracheal deviation present.  Cardiovascular: Normal rate.   Pulmonary/Chest: Effort normal. No respiratory distress.  Musculoskeletal: Normal range of motion.  Negative Tinel's Test and Phalen's Test. Bilateral grip strength equal and normal strength with abduction of the shoulders.    Neurological: She is alert and oriented to person, place, and time.  Skin: Skin is warm and dry. There is erythema.  1.5 cm erythematous lesion with central crusting on right lower extremity.  Psychiatric: She has a normal mood and affect. Her behavior is normal.  Nursing note and vitals reviewed.  ED Treatments / Results  Labs (all labs ordered are listed, but only abnormal results are displayed) Labs Reviewed - No data to display  EKG  EKG Interpretation None       Radiology No results found.  Procedures .Marland KitchenIncision and Drainage Date/Time: 11/23/2016 10:08 PM Performed by: Margarita Mail Authorized by: Margarita Mail   Consent:    Consent obtained:  Verbal   Consent given by:  Patient   Risks discussed:  Incomplete drainage Location:    Type:  Abscess   Size:  2cm   Location:  Lower extremity   Lower extremity location:  Leg   Leg location:  R lower leg Procedure type:    Complexity:  Simple Procedure details:    Incision types:  Stab incision   Scalpel blade:  11   Wound management:  Probed and deloculated   Drainage:  Bloody and purulent   Drainage amount:  Scant Post-procedure details:    Patient tolerance of procedure:  Tolerated well, no immediate complications   (including critical care time)  DIAGNOSTIC STUDIES: Oxygen Saturation is 100% on RA, normal by my interpretation.    COORDINATION OF CARE: 7:56 PM Discussed treatment plan with pt at bedside and pt agreed to plan.  Medications Ordered in ED Medications - No data to display   Initial Impression / Assessment and Plan / ED Course  I have reviewed the triage vital signs and the nursing notes.  Pertinent labs & imaging results that were available during my care of the patient were reviewed by me and considered in my medical decision making (see chart for details).    Patient with apparent scoliosis which is likely causing the symptoms. She feels on the left side of her neck. Patient also  has an abscess. This is drained. 2 small for packing. Mild signs of surrounding cellulitis. The patient is also having cystic patient with plate placed on doxycycline. Patient is to follow up with her PCP.She appears safe for discharge at this time.   Final Clinical Impressions(s) / ED Diagnoses   Final diagnoses:  Abscess  Other eczema    New Prescriptions Discharge Medication List as of 11/19/2016  9:55 PM    START taking these medications   Details  doxycycline (VIBRAMYCIN) 100 MG capsule Take 1 capsule (100 mg total) by mouth 2 (two) times daily., Starting Thu 11/19/2016, Print      I personally performed the services described in this documentation, which was scribed in my presence. The recorded information has been reviewed and is accurate.      Margarita Mail, PA-C 11/23/16 2210  Drenda Freeze, MD 11/23/16 [phone]

## 2016-11-19 NOTE — Discharge Instructions (Signed)
Follow these instructions at home: Abscess Care  If you have an abscess that has not drained, place a warm, clean, wet washcloth over the abscess several times a day. Do this as told by your health care provider. Follow instructions from your health care provider about how to take care of your abscess. Make sure you: Cover the abscess with a bandage (dressing). Change your dressing or gauze as told by your health care provider. Wash your hands with soap and water before you change the dressing or gauze. If soap and water are not available, use hand sanitizer. Check your abscess every day for signs of a worsening infection. Check for: More redness, swelling, or pain. More fluid or blood. Warmth. More pus or a bad smell. Medicines  Take over-the-counter and prescription medicines only as told by your health care provider. If you were prescribed an antibiotic medicine, take it as told by your health care provider. Do not stop taking the antibiotic even if you start to feel better. General instructions  To avoid spreading the infection: Do not share personal care items, towels, or hot tubs with others. Avoid making skin contact with other people. Keep all follow-up visits as told by your health care provider. This is important. Contact a health care provider if: You have more redness, swelling, or pain around your abscess. You have more fluid or blood coming from your abscess. Your abscess feels warm to the touch. You have more pus or a bad smell coming from your abscess. You have a fever. You have muscle aches. You have chills or a general ill feeling. Get help right away if: You have severe pain. You see red streaks on your skin spreading away from the abscess.

## 2017-04-01 ENCOUNTER — Telehealth: Payer: Self-pay

## 2017-04-01 ENCOUNTER — Ambulatory Visit (INDEPENDENT_AMBULATORY_CARE_PROVIDER_SITE_OTHER): Payer: Self-pay | Admitting: Nurse Practitioner

## 2017-04-01 DIAGNOSIS — Z111 Encounter for screening for respiratory tuberculosis: Secondary | ICD-10-CM

## 2017-04-01 NOTE — Telephone Encounter (Addendum)
Called patient - message was left by [facility] to contact office.

## 2017-04-01 NOTE — Progress Notes (Addendum)
Patient is here to have TB skin test.  Patient was advised to return in 34 - 72 hours to have TB skin test read.  PPD: Left arm 12:30 pm  Patient returned 04/04/2017 at 11:05 am to have PPD read.    This patient was not seen by provider, Reynolds Bowl, FNP-C.  This was a nurse only visit for a PPD.

## 2017-04-04 LAB — TB SKIN TEST
Induration: 0 mm
TB SKIN TEST: NEGATIVE

## 2017-04-04 NOTE — Telephone Encounter (Signed)
Called patient - Unable to leave voicemail, per recording mailbox is full.  Patient called back - advised she would need to have PPD read by 12:30 pm today. Patient stated she would be here by then.  Patient was given the $20 difference of payment back on 04/03/17.

## 2017-04-05 ENCOUNTER — Telehealth: Payer: Self-pay | Admitting: Emergency Medicine

## 2017-04-05 NOTE — Telephone Encounter (Signed)
Called patient to follow up with her and to see how she is doing since her visit,no response and I was unable to leave vm because her mailbox was full

## 2018-02-28 ENCOUNTER — Ambulatory Visit (INDEPENDENT_AMBULATORY_CARE_PROVIDER_SITE_OTHER): Payer: Self-pay | Admitting: Physician Assistant

## 2018-04-17 ENCOUNTER — Encounter (HOSPITAL_COMMUNITY): Payer: Self-pay | Admitting: Emergency Medicine

## 2018-04-17 ENCOUNTER — Emergency Department (HOSPITAL_COMMUNITY)
Admission: EM | Admit: 2018-04-17 | Discharge: 2018-04-17 | Disposition: A | Discharge status: Home / Self Care | Payer: Self-pay | Attending: Emergency Medicine | Admitting: Emergency Medicine

## 2018-04-17 ENCOUNTER — Other Ambulatory Visit: Payer: Self-pay

## 2018-04-17 DIAGNOSIS — L301 Dyshidrosis [pompholyx]: Secondary | ICD-10-CM | POA: Insufficient documentation

## 2018-04-17 DIAGNOSIS — F17210 Nicotine dependence, cigarettes, uncomplicated: Secondary | ICD-10-CM | POA: Insufficient documentation

## 2018-04-17 DIAGNOSIS — L2082 Flexural eczema: Secondary | ICD-10-CM | POA: Insufficient documentation

## 2018-04-17 MED ORDER — CLOBETASOL PROPIONATE 0.05 % EX OINT: 1.0000 "application " | TOPICAL_OINTMENT | Freq: Two times a day (BID) | CUTANEOUS | 0 refills | Status: DC

## 2018-04-17 MED ORDER — HYDROXYZINE HCL 25 MG PO TABS: 25.0000 mg | ORAL_TABLET | Freq: Four times a day (QID) | ORAL | 0 refills | Status: DC

## 2018-04-17 NOTE — ED Provider Notes (Signed)
[facility] EMERGENCY DEPARTMENT Provider Note   CSN: [id number] Arrival date & time: 04/17/18  1402     History   Chief Complaint Chief Complaint  Patient presents with  . Pruritis  . Eczema    HPI Virginia Perez is a 37 y.o. female with a history of eczema who presents to the emergency department with a chief complaint of rash.  She reports her eczema has been worse over the last couple of months due to the increased heat.  She reports that over the last couple of weeks she began to develop a rash on her bilateral hands.  The rash has worsened over the last 5 days.  She has been applying over-the-counter hydrocortisone cream to the rash and taking Benadryl as directed on the label for itching without improvement.  She states that the rash on her hands developed as blisters that have been draining some clear fluid.  The blisters are very pruritic, but have since burst.  No known sick contacts.  No new detergents or lotions, sleeping environment, new foods, or medications.  She denies fevers or chills.   The history is provided by the patient. No language interpreter was used.    History reviewed. No pertinent past medical history.  There are no active problems to display for this patient.   Past Surgical History:  Procedure Laterality Date  . CESAREAN SECTION    . TUBAL LIGATION  2012  . WISDOM TOOTH EXTRACTION       OB History   None      Home Medications    Prior to Admission medications   Medication Sig Start Date End Date Taking? Authorizing Provider  clobetasol ointment (TEMOVATE) 8.92 % Apply 1 application topically 2 (two) times daily. 04/17/18   Aric Jost A, PA-C  hydrOXYzine (ATARAX/VISTARIL) 25 MG tablet Take 1 tablet (25 mg total) by mouth every 6 (six) hours. 04/17/18   Catalino Plascencia A, PA-C    Family History No family history on file.  Social History Social History   Tobacco Use  . Smoking status: Current Some Day Smoker    Packs/day: 0.25    Types: Cigarettes  . Smokeless tobacco: Current User  Substance Use Topics  . Alcohol use: No  . Drug use: No     Allergies   Prednisone   Review of Systems Review of Systems  Constitutional: Negative for activity change, chills and fever.  Respiratory: Negative for shortness of breath.   Cardiovascular: Negative for chest pain.  Gastrointestinal: Negative for abdominal pain, nausea and vomiting.  Genitourinary: Negative for dysuria.  Musculoskeletal: Negative for back pain.  Skin: Positive for rash.       Pruritus   Allergic/Immunologic: Negative for immunocompromised state.  Neurological: Negative for weakness and headaches.  Psychiatric/Behavioral: Negative for confusion.     Physical Exam Updated Vital Signs BP (!) 144/61 (BP Location: Right Arm)   Pulse 70   Temp 98.8 F (37.1 C) (Oral)   Resp 16   Ht 5\' 6"  (1.676 m)   Wt 64.9 kg   LMP 04/06/2018   SpO2 100%   BMI 23.08 kg/m   Physical Exam  Constitutional: No distress.  HENT:  Head: Normocephalic.  Eyes: Conjunctivae are normal.  Neck: Neck supple.  Cardiovascular: Normal rate and regular rhythm. Exam reveals no gallop and no friction rub.  No murmur heard. Pulmonary/Chest: Effort normal. No stridor. No respiratory distress. She has no wheezes. She has no rales. She exhibits no  tenderness.  Abdominal: Soft. She exhibits no distension and no mass. There is no tenderness. There is no rebound and no guarding. No hernia.  Neurological: She is alert.  Skin: Skin is warm. No rash noted.  Few vesicles along the palmar surface of the bilateral hands.  Vesicles do not extend proximally to the wrist.  There are several areas of red, lichenified, scaling patches to the bilateral fingers, and on the dorsum of the hands and fingers, and on the palmar surface of the hands.  Lichenified skin present on the flexor surface of the bilateral elbows and knees.  No desquamation, petechiae, pustules, or  bulla.   Psychiatric: Her behavior is normal.  Nursing note and vitals reviewed.    ED Treatments / Results  Labs (all labs ordered are listed, but only abnormal results are displayed) Labs Reviewed - No data to display  EKG None  Radiology No results found.  Procedures Procedures (including critical care time)  Medications Ordered in ED Medications - No data to display   Initial Impression / Assessment and Plan / ED Course  I have reviewed the triage vital signs and the nursing notes.  Pertinent labs & imaging results that were available during my care of the patient were reviewed by me and considered in my medical decision making (see chart for details).     37 year old female with a history of eczema presenting with rash to the bilateral hands over the last few weeks.  She has been using eloped from the corticosteroid that was purchased over-the-counter.  On exam, rash is consistent with dyshidrotic eczema.  She has no angioedema. No blisters, no pustules, no warmth, no draining sinus tracts, no superficial abscesses, no bullous impetigo, no desquamation, no target lesions with dusky purpura or a central bulla. Not tender to touch. No concern for superimposed infection. No concern for SJS, TEN, TSS, tick borne illness, syphilis or other life-threatening condition. Will treat with high potency corticosteroid. Advised the patient not to apply to her face.  She was also advised that if her symptoms do not improve in 1 to 2 weeks she should follow-up with dermatology or with her PCP as long-term use can cause skin thinning or skin discoloration as well as other side effects.  She has been given a referral to [facility].  She is hemodynamically stable in no acute distress.  She is safe for discharge home with outpatient follow-up at this time.  Final Clinical Impressions(s) / ED Diagnoses   Final diagnoses:  Dyshidrotic hand dermatitis  Flexural eczema    ED  Discharge Orders         Ordered    clobetasol ointment (TEMOVATE) 0.05 %  2 times daily     04/17/18 1613    hydrOXYzine (ATARAX/VISTARIL) 25 MG tablet  Every 6 hours     04/17/18 1613           Ramaya Guile A, PA-C 04/17/18 Loman Chroman, MD 04/18/18 [phone]

## 2018-04-17 NOTE — ED Notes (Signed)
APP at bedside 

## 2018-04-17 NOTE — ED Triage Notes (Signed)
Pt. Stated, I have ezcema and I started getting bad around Tuesday this week. Ive developed blisters on my hands.

## 2018-04-17 NOTE — Discharge Instructions (Addendum)
Thank you for allowing me to care for you today in the Emergency Department.   Apply a thin layer of clobetasol cream to your rash 2 times per day.  This cream should not be applied to your face.  For your hands, I would make sure that you apply the cream one time at night and then wrapping her hands with a plastic wrap before you go to sleep.  This way the cream can be in contact with your skin longer.  You can take 1 tablet of Atarax every 6 hours as for itching.  You can also use Benadryl as directed on the label.  Use caution with clobetasol cream if you were out in the sun as it can cause skin discoloration or thinning of the skin with long-term use.  Since you have already been using some steroid cream for several weeks, I would strongly recommend following up with primary care or with a dermatologist if your symptoms do not start to improve in the next 1 to 2 weeks.  Some things you continue to reduce skin irritation and improve the barrier function of your skin include: Using lukewarm water and soap-free cleansers to wash hands Drying hands thoroughly after washing Applying emollients (eg, petroleum jelly) immediately after hand drying and as often as possible  Wearing cotton gloves under vinyl or other nonlatex gloves when performing wet work Removing rings and watches and bracelets before wet work Wearing protective gloves in cold weather  Wearing task-specific gloves for frictional exposures (eg, gardening, carpentry) Avoiding exposure to irritants (eg, detergents, solvents, hair lotions or dyes, acidic foods [eg, citrus fruit])   Return to the emergency department if you develop new or worsening symptoms including fever or chills, or if the area gets swollen, warm to the touch, red, or if you notice red streaking the arms or legs.

## 2018-10-22 ENCOUNTER — Ambulatory Visit (HOSPITAL_COMMUNITY)
Admission: EM | Admit: 2018-10-22 | Discharge: 2018-10-22 | Disposition: A | Discharge status: Home / Self Care | Payer: Self-pay | Attending: Internal Medicine | Admitting: Internal Medicine

## 2018-10-22 ENCOUNTER — Other Ambulatory Visit: Payer: Self-pay

## 2018-10-22 ENCOUNTER — Encounter (HOSPITAL_COMMUNITY): Payer: Self-pay

## 2018-10-22 DIAGNOSIS — L308 Other specified dermatitis: Secondary | ICD-10-CM

## 2018-10-22 DIAGNOSIS — L309 Dermatitis, unspecified: Secondary | ICD-10-CM

## 2018-10-22 DIAGNOSIS — L2089 Other atopic dermatitis: Secondary | ICD-10-CM

## 2018-10-22 MED ORDER — KETOROLAC TROMETHAMINE 30 MG/ML IJ SOLN
30.0000 mg | Freq: Once | INTRAMUSCULAR | Status: AC
  Administered 2018-10-22: 30 mg via INTRAMUSCULAR

## 2018-10-22 MED ORDER — KETOROLAC TROMETHAMINE 30 MG/ML IJ SOLN
INTRAMUSCULAR | Status: AC
  Filled 2018-10-22: qty 1

## 2018-10-22 MED ORDER — TRIAMCINOLONE ACETONIDE 0.1 % EX CREA: 1.0000 "application " | TOPICAL_CREAM | Freq: Two times a day (BID) | CUTANEOUS | 0 refills | Status: DC

## 2018-10-22 NOTE — ED Triage Notes (Signed)
Pt cc both hands a have a rash on her hands.

## 2018-10-22 NOTE — ED Provider Notes (Signed)
[place]    CSN: [id number] Arrival date & time: 10/22/18  1213     History   Chief Complaint Chief Complaint  Patient presents with  . Rash    HPI Virginia Perez is a 38 y.o. female with a history of atopic dermatitis comes to the urgent care with complaints of worsening pruritus and eczematous rash. Rash started from the usual flexural areas and has progressed to involve the hands.  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  She has experienced severe rash on both hands with desquamation of her skin.  She has been applying low potency steroids on her hands with no improvement in her symptoms.  She has not seen a dermatologist before.  HPI  History reviewed. No pertinent past medical history.  There are no active problems to display for this patient.   Past Surgical History:  Procedure Laterality Date  . CESAREAN SECTION    . TUBAL LIGATION  2012  . WISDOM TOOTH EXTRACTION      OB History   No obstetric history on file.      Home Medications    Prior to Admission medications   Medication Sig Start Date End Date Taking? Authorizing Provider  clobetasol ointment (TEMOVATE) 3.55 % Apply 1 application topically 2 (two) times daily. 04/17/18   McDonald, Mia A, PA-C  hydrOXYzine (ATARAX/VISTARIL) 25 MG tablet Take 1 tablet (25 mg total) by mouth every 6 (six) hours. 04/17/18   McDonald, Mia A, PA-C  triamcinolone cream (KENALOG) 0.1 % Apply 1 application topically 2 (two) times daily. 10/22/18   Maverik Foot, Myrene Galas, MD    Family History History reviewed. No pertinent family history.  Social History Social History   Tobacco Use  . Smoking status: Current Some Day Smoker    Packs/day: 0.25    Types: Cigarettes  . Smokeless tobacco: Current User  Substance Use Topics  . Alcohol use: No  . Drug use: No     Allergies   Prednisone   Review of Systems Review of Systems  Constitutional: Negative for activity change and  appetite change.  Respiratory: Negative for cough, chest tightness and wheezing.   Genitourinary: Negative for dysuria and urgency.  Musculoskeletal: Negative for arthralgias, myalgias and neck stiffness.  Skin: Positive for rash. Negative for pallor and wound.  Neurological: Negative for dizziness, weakness and numbness.  All other systems reviewed and are negative.    Physical Exam Triage Vital Signs ED Triage Vitals  Enc Vitals Group     BP 10/22/18 1332 (!) 140/91     Pulse --      Resp 10/22/18 1332 18     Temp 10/22/18 1332 98.5 F (36.9 C)     Temp Source 10/22/18 1332 Oral     SpO2 10/22/18 1332 100 %     Weight --      Height --      Head Circumference --      Peak Flow --      Pain Score 10/22/18 1333 6     Pain Loc --      Pain Edu? --      Excl. in [place]? --    No data found.  Updated Vital Signs BP (!) 140/91 (BP Location: Right Arm)   Temp 98.5 F (36.9 C) (Oral)   Resp 18   LMP 09/09/2018   SpO2 100%   Visual Acuity Right Eye Distance:   Left Eye Distance:  Bilateral Distance:    Right Eye Near:   Left Eye Near:    Bilateral Near:     Physical Exam Constitutional:      Appearance: Normal appearance. She is not ill-appearing.  HENT:     Right Ear: Tympanic membrane normal.     Left Ear: Tympanic membrane normal.     Nose: Nose normal.     Mouth/Throat:     Pharynx: Oropharynx is clear. No posterior oropharyngeal erythema.  Eyes:     General:        Left eye: No discharge.  Neck:     Musculoskeletal: Normal range of motion and neck supple.  Cardiovascular:     Rate and Rhythm: Normal rate and regular rhythm.     Pulses: Normal pulses.     Heart sounds: Normal heart sounds.  Pulmonary:     Effort: Pulmonary effort is normal. No respiratory distress.     Breath sounds: Normal breath sounds. No wheezing or rhonchi.  Abdominal:     General: Bowel sounds are normal.     Palpations: Abdomen is soft.  Musculoskeletal: Normal range of  motion.  Skin:    General: Skin is warm.     Capillary Refill: Capillary refill takes less than 2 seconds.     Findings: Rash present.     Comments: Desquamating denuded skin lesions on both hands.  Lesions look like first-degree burns with no surrounding erythema  Neurological:     Mental Status: She is alert.     UC Treatments / Results  Labs (all labs ordered are listed, but only abnormal results are displayed) Labs Reviewed - No data to display  EKG None  Radiology No results found.  Procedures Procedures (including critical care time)  Medications Ordered in UC Medications  ketorolac (TORADOL) 30 MG/ML injection 30 mg (30 mg Intramuscular Given 10/22/18 1442)    Initial Impression / Assessment and Plan / UC Course  I have reviewed the triage vital signs and the nursing notes.  Pertinent labs & imaging results that were available during my care of the patient were reviewed by me and considered in my medical decision making (see chart for details).      1.  Eczematous dermatitis:  Start triamcinolone cream twice daily. Apply to hands Dermatology referral Patient may require steroid sparing immunosuppressant creams. Final Clinical Impressions(s) / UC Diagnoses   Final diagnoses:  Eczema, unspecified type   Discharge Instructions   None    ED Prescriptions    Medication Sig Dispense Auth. Provider   triamcinolone cream (KENALOG) 0.1 % Apply 1 application topically 2 (two) times daily. 30 g Chase Picket, MD     Controlled Substance Prescriptions [place] Controlled Substance Registry consulted? No   Chase Picket, MD 10/22/18 1529

## 2019-02-12 ENCOUNTER — Emergency Department
Admission: EM | Admit: 2019-02-12 | Discharge: 2019-02-12 | Disposition: A | Discharge status: Home / Self Care | Payer: Self-pay | Attending: Emergency Medicine | Admitting: Emergency Medicine

## 2019-02-12 ENCOUNTER — Other Ambulatory Visit: Payer: Self-pay

## 2019-02-12 DIAGNOSIS — Z720 Tobacco use: Secondary | ICD-10-CM | POA: Insufficient documentation

## 2019-02-12 DIAGNOSIS — N938 Other specified abnormal uterine and vaginal bleeding: Secondary | ICD-10-CM | POA: Insufficient documentation

## 2019-02-12 DIAGNOSIS — N939 Abnormal uterine and vaginal bleeding, unspecified: Secondary | ICD-10-CM

## 2019-02-12 LAB — URINALYSIS, COMPLETE (UACMP) WITH MICROSCOPIC
Bacteria, UA: NONE SEEN
Bilirubin Urine: NEGATIVE
Glucose, UA: NEGATIVE mg/dL
Ketones, ur: NEGATIVE mg/dL
Leukocytes,Ua: NEGATIVE
Nitrite: NEGATIVE
Protein, ur: 30 mg/dL — AB
RBC / HPF: >50 RBC/hpf — ABNORMAL HIGH (ref 0–5)
Specific Gravity, Urine: 1.035 — ABNORMAL HIGH (ref 1.005–1.030)
pH: 5 (ref 5.0–8.0)

## 2019-02-12 LAB — COMPREHENSIVE METABOLIC PANEL
ALT: 18 U/L (ref 0–44)
AST: 26 U/L (ref 15–41)
Albumin: 4.6 g/dL (ref 3.5–5.0)
Alkaline Phosphatase: 61 U/L (ref 38–126)
Anion gap: 7 (ref 5–15)
BUN: 14 mg/dL (ref 6–20)
CO2: 28 mmol/L (ref 22–32)
Calcium: 9.1 mg/dL (ref 8.9–10.3)
Chloride: 103 mmol/L (ref 98–111)
Creatinine, Ser: 0.69 mg/dL (ref 0.44–1.00)
GFR calc Af Amer: >60 mL/min (ref >60)
GFR calc non Af Amer: >60 mL/min (ref >60)
Glucose, Bld: 125 mg/dL — ABNORMAL HIGH (ref 70–99)
Potassium: 3.8 mmol/L (ref 3.5–5.1)
Sodium: 138 mmol/L (ref 135–145)
Total Bilirubin: 0.7 mg/dL (ref 0.3–1.2)
Total Protein: 8.1 g/dL (ref 6.5–8.1)

## 2019-02-12 LAB — WET PREP, GENITAL
Clue Cells Wet Prep HPF POC: NONE SEEN
Sperm: NONE SEEN
Trich, Wet Prep: NONE SEEN
Yeast Wet Prep HPF POC: NONE SEEN

## 2019-02-12 LAB — CBC
HCT: 38 % (ref 36.0–46.0)
Hemoglobin: 12.5 g/dL (ref 12.0–15.0)
MCH: 31.1 pg (ref 26.0–34.0)
MCHC: 32.9 g/dL (ref 30.0–36.0)
MCV: 94.5 fL (ref 80.0–100.0)
Platelets: 259 10*3/uL (ref 150–400)
RBC: 4.02 MIL/uL (ref 3.87–5.11)
RDW: 17.5 % — ABNORMAL HIGH (ref 11.5–15.5)
WBC: 5.8 10*3/uL (ref 4.0–10.5)
nRBC: 0 % (ref 0.0–0.2)

## 2019-02-12 LAB — CHLAMYDIA/NGC RT PCR (ARMC ONLY)
Chlamydia Tr: NOT DETECTED
N gonorrhoeae: NOT DETECTED

## 2019-02-12 LAB — HCG, QUANTITATIVE, PREGNANCY: hCG, Beta Chain, Quant, S: <1 m[IU]/mL (ref <5)

## 2019-02-12 LAB — POCT PREGNANCY, URINE: Preg Test, Ur: NEGATIVE

## 2019-02-12 MED ORDER — IBUPROFEN 600 MG PO TABS
600.0000 mg | ORAL_TABLET | Freq: Once | ORAL | Status: AC
  Administered 2019-02-12: 600 mg via ORAL
  Filled 2019-02-12: qty 1

## 2019-02-12 NOTE — ED Provider Notes (Signed)
[facility] Emergency Department Provider Note  ____________________________________________  Time seen: Approximately 4:53 AM  I have reviewed the triage vital signs and the nursing notes.   HISTORY  Chief Complaint Abdominal Pain and Vaginal Bleeding   HPI Virginia Perez is a 38 y.o. female with no significant past medical history who presents for evaluation of vaginal bleeding and abdominal pain.  Patient reports that her last menstrual period was 3 weeks ago.  She is usually pretty regular.  This episode started less than 12 hours ago.  She is complaining of severe suprapubic cramping pain associated with heavier than normal vaginal bleeding.  The pain is similar to her menstrual cramps however more intense.  She took Tylenol at home with no significant relief.  She also reports that the bleeding is more red than her normal period.   PMH None - reviewed  Past Surgical History:  Procedure Laterality Date  . CESAREAN SECTION    . TUBAL LIGATION  2012  . WISDOM TOOTH EXTRACTION      Prior to Admission medications   Medication Sig Start Date End Date Taking? Authorizing Provider  clobetasol ointment (TEMOVATE) 5.45 % Apply 1 application topically 2 (two) times daily. 04/17/18   McDonald, Mia A, PA-C  hydrOXYzine (ATARAX/VISTARIL) 25 MG tablet Take 1 tablet (25 mg total) by mouth every 6 (six) hours. 04/17/18   McDonald, Mia A, PA-C  triamcinolone cream (KENALOG) 0.1 % Apply 1 application topically 2 (two) times daily. 10/22/18   Chase Picket, MD    Allergies Prednisone  No family history on file.  Social History Social History   Tobacco Use  . Smoking status: Current Some Day Smoker    Packs/day: 0.25    Types: Cigarettes  . Smokeless tobacco: Current User  Substance Use Topics  . Alcohol use: No  . Drug use: No    Review of Systems  Constitutional: Negative for fever. Eyes: Negative for visual changes. ENT: Negative for sore  throat. Neck: No neck pain  Cardiovascular: Negative for chest pain. Respiratory: Negative for shortness of breath. Gastrointestinal: + suprapubic abdominal pain. No vomiting or diarrhea. Genitourinary: Negative for dysuria. + vaginal bleeding Musculoskeletal: Negative for back pain. Skin: Negative for rash. Neurological: Negative for headaches, weakness or numbness. Psych: No SI or HI  ____________________________________________   PHYSICAL EXAM:  VITAL SIGNS: ED Triage Vitals  Enc Vitals Group     BP 02/12/19 0407 (!) 165/94     Pulse Rate 02/12/19 0407 79     Resp 02/12/19 0407 16     Temp 02/12/19 0407 98.2 F (36.8 C)     Temp Source 02/12/19 0407 Oral     SpO2 02/12/19 0407 100 %     Weight 02/12/19 0411 145 lb (65.8 kg)     Height 02/12/19 0411 5\' 6"  (1.676 m)     Head Circumference --      Peak Flow --      Pain Score 02/12/19 0410 8     Pain Loc --      Pain Edu? --      Excl. in [place]? --     Constitutional: Alert and oriented. Well appearing and in no apparent distress. HEENT:      Head: Normocephalic and atraumatic.         Eyes: Conjunctivae are normal. Sclera is non-icteric.       Mouth/Throat: Mucous membranes are moist.       Neck: Supple with no  signs of meningismus. Cardiovascular: Regular rate and rhythm. No murmurs, gallops, or rubs. 2+ symmetrical distal pulses are present in all extremities. No JVD. Respiratory: Normal respiratory effort. Lungs are clear to auscultation bilaterally. No wheezes, crackles, or rhonchi.  Gastrointestinal: Soft, non tender, and non distended with positive bowel sounds. No rebound or guarding. Pelvic exam: Normal external genitalia, no rashes or lesions. Blood in the vaginal vault from the os. Os closed. No cervical motion tenderness.  No uterine or adnexal tenderness.   Musculoskeletal: Nontender with normal range of motion in all extremities. No edema, cyanosis, or erythema of extremities. Neurologic: Normal speech and  language. Face is symmetric. Moving all extremities. No gross focal neurologic deficits are appreciated. Skin: Skin is warm, dry and intact. No rash noted. Psychiatric: Mood and affect are normal. Speech and behavior are normal.  ____________________________________________   LABS (all labs ordered are listed, but only abnormal results are displayed)  Labs Reviewed  COMPREHENSIVE METABOLIC PANEL - Abnormal; Notable for the following components:      Result Value   Glucose, Bld 125 (*)    All other components within normal limits  CBC - Abnormal; Notable for the following components:   RDW 17.5 (*)    All other components within normal limits  URINALYSIS, COMPLETE (UACMP) WITH MICROSCOPIC - Abnormal; Notable for the following components:   Color, Urine YELLOW (*)    APPearance HAZY (*)    Specific Gravity, Urine 1.035 (*)    Hgb urine dipstick LARGE (*)    Protein, ur 30 (*)    RBC / HPF >50 (*)    All other components within normal limits  WET PREP, GENITAL  CHLAMYDIA/NGC RT PCR ([facility] ONLY)  HCG, QUANTITATIVE, PREGNANCY  POC URINE PREG, ED   ____________________________________________  EKG  none  ____________________________________________  RADIOLOGY  none  ____________________________________________   PROCEDURES  Procedure(s) performed: None Procedures Critical Care performed:  None ____________________________________________   INITIAL IMPRESSION / ASSESSMENT AND PLAN / ED COURSE  38 y.o. female with no significant past medical history who presents for evaluation of vaginal bleeding and abdominal pain.  Most likely an abnormal menstrual period. Pelvic exam normal. Pregnancy test negative. STD testing negative. HD stable. Hgb stable at 12.5. Recommended ibuprofen for cramping and f/u with OBGYN.  Discussed return precautions for new or worsening abdominal pain or any signs of acute blood loss anemia.      As part of my medical decision making, I  reviewed the following data within the [place] notes reviewed and incorporated, Labs reviewed , Old chart reviewed, Notes from prior ED visits and [place] Controlled Substance Database    Pertinent labs & imaging results that were available during my care of the patient were reviewed by me and considered in my medical decision making (see chart for details).    ____________________________________________   FINAL CLINICAL IMPRESSION(S) / ED DIAGNOSES  Final diagnoses:  Abnormal uterine bleeding      NEW MEDICATIONS STARTED DURING THIS VISIT:  ED Discharge Orders    None       Note:  This document was prepared using Dragon voice recognition software and may include unintentional dictation errors.    Rudene Re, MD 02/24/19 [phone]

## 2019-02-12 NOTE — ED Notes (Signed)
PT WITH NEGATIVE URINE PREGNANCY TEST IN TRIAGE

## 2019-02-12 NOTE — ED Notes (Signed)
ED Provider at bedside. 

## 2019-02-12 NOTE — ED Triage Notes (Signed)
Pt states onset of vaginal "bright red spotting" and lower abd pain yesterday. Pt appears in no acute distress. Pt denies vomiting, diarrhea, nausea.

## 2019-02-24 ENCOUNTER — Encounter: Payer: Self-pay | Admitting: Family Medicine

## 2019-02-24 ENCOUNTER — Other Ambulatory Visit: Payer: Self-pay

## 2019-02-24 NOTE — Progress Notes (Signed)
Pt contacted via doxy for appt.  Pt joined call, however could not complete visit as unable to access video and microphone.  Pt advised to allow app access via settings with no avail.

## 2019-04-05 ENCOUNTER — Ambulatory Visit: Payer: Self-pay | Admitting: Family Medicine

## 2019-04-05 ENCOUNTER — Inpatient Hospital Stay: Payer: Self-pay | Admitting: Family Medicine

## 2019-04-12 ENCOUNTER — Ambulatory Visit (INDEPENDENT_AMBULATORY_CARE_PROVIDER_SITE_OTHER): Payer: Self-pay | Admitting: Obstetrics & Gynecology

## 2019-04-12 ENCOUNTER — Other Ambulatory Visit: Payer: Self-pay

## 2019-04-12 ENCOUNTER — Encounter: Payer: Self-pay | Admitting: Obstetrics & Gynecology

## 2019-04-12 VITALS — BP 120/78 | Ht 66.0 in | Wt 156.0 lb

## 2019-04-12 DIAGNOSIS — R102 Pelvic and perineal pain: Secondary | ICD-10-CM

## 2019-04-12 DIAGNOSIS — N945 Secondary dysmenorrhea: Secondary | ICD-10-CM

## 2019-04-12 DIAGNOSIS — N921 Excessive and frequent menstruation with irregular cycle: Secondary | ICD-10-CM

## 2019-04-12 NOTE — Patient Instructions (Addendum)
1. Menometrorrhagia Gynecologic exam with slightly increased uterine size with nodularities, possible small uterine fibroids.  We will proceed with a pelvic ultrasound to evaluate the uterus with the endometrial lining and the ovaries.  CBC, TSH and prolactin drawn today.  Follow-up with pelvic ultrasound and discussion of results.  Will decide on management per results. - CBC - TSH - Prolactin - US Transvaginal Non-OB; Future  2. Secondary dysmenorrhea - US Transvaginal Non-OB; Future  3. Pelvic pain in female - US Transvaginal Non-OB; Future  Virginia Perez, it was a pleasure meeting you today!  I will inform you of your results as soon as they are available.

## 2019-04-12 NOTE — Progress Notes (Signed)
Virginia Perez 1981-03-16 [id number]   History:    38 y.o. G2P2L2  Single.  Has a boyfriend  RP:  New patient presenting for irregular vaginal bleeding with lower abdominal pain x many months  HPI:  Heavy menses every month with bleeding in between x a couple of years.  Had dysmenorrhea with the heavy bleeding and BTB.  Now for the last 2 months, has lower abdominal pain out of the blue without bleeding occasionally.  The pain can be severe, crampy and lasts a few minutes.  No abnormal vaginal discharge.  Has pain with IC.  Urine/BMs normal.  Past medical history,surgical history, family history and social history were all reviewed and documented in the EPIC chart.  Gynecologic History Patient's last menstrual period was 03/24/2019. Contraception: Essure x 2012. Last Pap: 04/2012. Results were: Negative Last mammogram: Never Bone Density: Never Colonoscopy: Never  Obstetric History OB History  Gravida Para Term Preterm AB Living  2 2       2   SAB TAB Ectopic Multiple Live Births               # Outcome Date GA Lbr Len/2nd Weight Sex Delivery Anes PTL Lv  2 Para           1 Para              ROS: A ROS was performed and pertinent positives and negatives are included in the history.  GENERAL: No fevers or chills. HEENT: No change in vision, no earache, sore throat or sinus congestion. NECK: No pain or stiffness. CARDIOVASCULAR: No chest pain or pressure. No palpitations. PULMONARY: No shortness of breath, cough or wheeze. GASTROINTESTINAL: No abdominal pain, nausea, vomiting or diarrhea, melena or bright red blood per rectum. GENITOURINARY: No urinary frequency, urgency, hesitancy or dysuria. MUSCULOSKELETAL: No joint or muscle pain, no back pain, no recent trauma. DERMATOLOGIC: No rash, no itching, no lesions. ENDOCRINE: No polyuria, polydipsia, no heat or cold intolerance. No recent change in weight. HEMATOLOGICAL: No anemia or easy bruising or bleeding. NEUROLOGIC: No  headache, seizures, numbness, tingling or weakness. PSYCHIATRIC: No depression, no loss of interest in normal activity or change in sleep pattern.     Exam:   BP 120/78   Ht 5\' 6"  (1.676 m)   Wt 156 lb (70.8 kg)   LMP 03/24/2019 Comment: essure 2012  BMI 25.18 kg/m   Body mass index is 25.18 kg/m.  General appearance : Well developed well nourished female. No acute distress HEENT: Eyes: no retinal hemorrhage or exudates,  Neck supple, trachea midline, no carotid bruits, no thyroidmegaly Lungs: Clear to auscultation, no rhonchi or wheezes, or rib retractions  Heart: Regular rate and rhythm, no murmurs or gallops Breast:Examined in sitting and supine position were symmetrical in appearance, no palpable masses or tenderness,  no skin retraction, no nipple inversion, no nipple discharge, no skin discoloration, no axillary or supraclavicular lymphadenopathy Abdomen: no palpable masses or tenderness, no rebound or guarding Extremities: no edema or skin discoloration or tenderness  Pelvic: Vulva: Normal             Vagina: No gross lesions or discharge  Cervix: No gross lesions or discharge  Uterus  AV, slightly increased in size and nodular, non-tender and mobile  Adnexa  Without masses or tenderness  Anus: Normal   Assessment/Plan:  38 y.o. female for annual exam   1. Menometrorrhagia Gynecologic exam with slightly increased uterine size with nodularities, possible small uterine fibroids.  We will proceed with a pelvic ultrasound to evaluate the uterus with the endometrial lining and the ovaries.  CBC, TSH and prolactin drawn today.  Follow-up with pelvic ultrasound and discussion of results.  Will decide on management per results. - CBC - TSH - Prolactin - US Transvaginal Non-OB; Future  2. Secondary dysmenorrhea - US Transvaginal Non-OB; Future  3. Pelvic pain in female - US Transvaginal Non-OB; Future  Counseling on above issues and coordination of care more than 50% for  45 minutes.  Princess Bruins MD, 11:26 AM 04/12/2019

## 2019-04-13 LAB — CBC
HCT: 36.5 % (ref 35.0–45.0)
Hemoglobin: 12.2 g/dL (ref 11.7–15.5)
MCH: 33.2 pg — ABNORMAL HIGH (ref 27.0–33.0)
MCHC: 33.4 g/dL (ref 32.0–36.0)
MCV: 99.2 fL (ref 80.0–100.0)
MPV: 10.7 fL (ref 7.5–12.5)
Platelets: 218 10*3/uL (ref 140–400)
RBC: 3.68 10*6/uL — ABNORMAL LOW (ref 3.80–5.10)
RDW: 12.4 % (ref 11.0–15.0)
WBC: 4.6 10*3/uL (ref 3.8–10.8)

## 2019-04-13 LAB — PROLACTIN: Prolactin: 12 ng/mL

## 2019-04-13 LAB — TSH: TSH: 1.01 mIU/L

## 2020-01-31 ENCOUNTER — Other Ambulatory Visit: Payer: Self-pay

## 2020-01-31 ENCOUNTER — Ambulatory Visit: Payer: Self-pay | Admitting: Physician Assistant

## 2020-01-31 VITALS — BP 155/98 | HR 70 | Temp 98.2°F | Resp 18 | Ht 66.0 in | Wt 163.0 lb

## 2020-01-31 DIAGNOSIS — R7309 Other abnormal glucose: Secondary | ICD-10-CM

## 2020-01-31 DIAGNOSIS — R030 Elevated blood-pressure reading, without diagnosis of hypertension: Secondary | ICD-10-CM

## 2020-01-31 DIAGNOSIS — L2082 Flexural eczema: Secondary | ICD-10-CM

## 2020-01-31 DIAGNOSIS — L0292 Furuncle, unspecified: Secondary | ICD-10-CM

## 2020-01-31 DIAGNOSIS — L0293 Carbuncle, unspecified: Secondary | ICD-10-CM

## 2020-01-31 LAB — POCT GLYCOSYLATED HEMOGLOBIN (HGB A1C): Hemoglobin A1C: 5 % (ref 4.0–5.6)

## 2020-01-31 MED ORDER — MUPIROCIN CALCIUM 2 % NA OINT: 1.0000 "application " | TOPICAL_OINTMENT | Freq: Two times a day (BID) | NASAL | 0 refills | Status: DC

## 2020-01-31 MED ORDER — TRIAMCINOLONE ACETONIDE 0.1 % EX CREA: 1.0000 "application " | TOPICAL_CREAM | Freq: Two times a day (BID) | CUTANEOUS | 1 refills | Status: DC

## 2020-01-31 MED ORDER — SULFAMETHOXAZOLE-TRIMETHOPRIM 800-160 MG PO TABS: 1.0000 | ORAL_TABLET | Freq: Two times a day (BID) | ORAL | 0 refills | Status: DC

## 2020-01-31 NOTE — Progress Notes (Signed)
New Patient Office Visit  Subjective:  Patient ID: Virginia Perez, female    DOB: 1981-04-11  Age: 39 y.o. MRN: [id number]  CC:  Chief Complaint  Patient presents with  . Recurrent Skin Infections    HPI Virginia Perez reports that she has had recurrent skin infections that she describes as boils on her face, ears, neck, and scalp.  Reports that they are painful, become inflamed, will drain.  Reports that she uses.  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    States that she has never been diagnosed with hypertension in the past, does not check blood pressure at home, does not watch sodium intake, does not drink much water.  Denies any hypertensive symptoms.    History reviewed. No pertinent past medical history.  Past Surgical History:  Procedure Laterality Date  . CESAREAN SECTION    . hemorrhoidectomy    . TUBAL LIGATION  2012  . WISDOM TOOTH EXTRACTION      Family History  Problem Relation Age of Onset  . Hypertension Mother   . Hypertension Father   . Hypertension Maternal Grandmother     Social History   Socioeconomic History  . Marital status: Single    Spouse name: Not on file  . Number of children: Not on file  . Years of education: Not on file  . Highest education level: Not on file  Occupational History  . Not on file  Tobacco Use  . Smoking status: Current Some Day Smoker    Packs/day: 0.25    Types: Cigarettes  . Smokeless tobacco: Current User  Vaping Use  . Vaping Use: Never used  Substance and Sexual Activity  . Alcohol use: No  . Drug use: No  . Sexual activity: Yes    Partners: Male    Birth control/protection: None, Surgical    Comment: 1st intercourse- 15, partners- 55, current partner - 12 yrs   Other Topics Concern  . Not on file  Social History Narrative  . Not on file   Social  Determinants of Health   Financial Resource Strain:   . Difficulty of Paying Living Expenses:   Food Insecurity:   . Worried About Charity fundraiser in the Last Year:   . Arboriculturist in the Last Year:   Transportation Needs:   . Film/video editor (Medical):   Marland Kitchen Lack of Transportation (Non-Medical):   Physical Activity:   . Days of Exercise per Week:   . Minutes of Exercise per Session:   Stress:   . Feeling of Stress :   Social Connections:   . Frequency of Communication with Friends and Family:   . Frequency of Social Gatherings with Friends and Family:   . Attends Religious Services:   . Active Member of Clubs or Organizations:   . Attends Archivist Meetings:   Marland Kitchen Marital Status:   Intimate Partner Violence:   . Fear of Current or Ex-Partner:   . Emotionally Abused:   Marland Kitchen Physically Abused:   . Sexually Abused:     ROS Review of Systems  Constitutional: Negative for chills, fatigue and fever.  HENT: Negative.   Eyes: Negative.   Respiratory: Negative.   Cardiovascular: Negative for chest pain and palpitations.  Gastrointestinal: Negative.   Endocrine: Negative.   Genitourinary: Negative.   Musculoskeletal: Negative.  Skin: Positive for rash.  Allergic/Immunologic: Negative.   Neurological: Negative.   Hematological: Negative.   Psychiatric/Behavioral: Negative.     Objective:   Today's Vitals: BP (!) 155/98 (BP Location: Left Arm, Patient Position: Sitting, Cuff Size: Normal)   Pulse 70   Temp 98.2 F (36.8 C) (Oral)   Resp 18   Ht 5\' 6"  (1.676 m)   Wt 163 lb (73.9 kg)   SpO2 100%   BMI 26.31 kg/m   Physical Exam Skin:         Assessment & Plan:   Problem List Items Addressed This Visit    None    Visit Diagnoses    Elevated glucose level    -  Primary   Relevant Orders   HgB A1c (Completed)   Flexural eczema       Relevant Medications   mupirocin nasal ointment (BACTROBAN) 2 %   triamcinolone cream (KENALOG) 0.1 %     Boil       Relevant Medications   sulfamethoxazole-trimethoprim (BACTRIM DS) 800-160 MG tablet   Elevated blood pressure reading in office without diagnosis of hypertension       Recurrent boils       Relevant Medications   sulfamethoxazole-trimethoprim (BACTRIM DS) 800-160 MG tablet      Outpatient Encounter Medications as of 01/31/2020  Medication Sig  . mupirocin nasal ointment (BACTROBAN) 2 % Place 1 application into the nose 2 (two) times daily. Use one-half of tube in each nostril twice daily for five (5) days. After application, press sides of nose together and gently massage.  . sulfamethoxazole-trimethoprim (BACTRIM DS) 800-160 MG tablet Take 1 tablet by mouth 2 (two) times daily.  Marland Kitchen triamcinolone cream (KENALOG) 0.1 % Apply 1 application topically 2 (two) times daily.   No facility-administered encounter medications on file as of 01/31/2020.  1. Elevated glucose level A1c 5.0 - HgB A1c  2. Flexural eczema Gave patient education on preventive measures - triamcinolone cream (KENALOG) 0.1 %; Apply 1 application topically 2 (two) times daily.  Dispense: 453 g; Refill: 1  3. Boil (acute and chronic)  - sulfamethoxazole-trimethoprim (BACTRIM DS) 800-160 MG tablet; Take 1 tablet by mouth 2 (two) times daily.  Dispense: 20 tablet; Refill: 0 - mupirocin nasal ointment (BACTROBAN) 2 %; Place 1 application into the nose 2 (two) times daily. Use one-half of tube in each nostril twice daily for five (5) days. After application, press sides of nose together and gently massage.  Dispense: 10 g; Refill: 0  4. Elevated blood pressure reading in office without diagnosis of hypertension Encouraged patient to obtain blood pressure cuff, check blood pressure, keep log, reduce sodium, increase water intake.  Patient was given information on [place] financial assistance, along with appointment to establish primary care on March 08, 2020 at patient care.  Patient encouraged to have blood  pressure log available for primary care provider.    I have reviewed the patient's medical history (PMH, PSH, Social History, Family History, Medications, and allergies) , and have been updated if relevant. I spent 30 minutes reviewing chart and  face to face time with patient.     Follow-up: Return in about 5 weeks (around 03/08/2020) for To establish PCP, At Patient Care.   Loraine Grip Mayers, PA-C

## 2020-01-31 NOTE — Patient Instructions (Signed)
I encourage you to monitor your blood pressure at home, keep a log of your daily readings and take that with you when you see your primary care provider on July 30.  If your readings continue to be elevated, you are welcome to return to the mobile unit for prompt evaluation.  I have sent an antibiotic that you will take orally for your boils, you will also use an antibiotic cream to help prevent future breakouts as we discussed.  I have also sent a steroid cream to help you with your eczema breakout.  I encourage you to increase your hydration and limit sodium  Thank you for trusting [place] with your care  Kennieth Rad, PA-C Physician Assistant [place] [email or web address]     Eczema Eczema is a broad term for a group of skin conditions that cause skin to become rough and inflamed. Each type of eczema has different triggers, symptoms, and treatments. Eczema of any type is usually itchy and symptoms range from mild to severe. Eczema and its symptoms are not spread from person to person (are not contagious). It can appear on different parts of the body at different times. Your eczema may not look the same as someone else's eczema. What are the types of eczema? Atopic dermatitis This is a long-term (chronic) skin disease that keeps coming back (recurring). Usual symptoms are dry skin and small, solid pimples that may swell and leak fluid (weep). Contact dermatitis  This happens when something irritates the skin and causes a rash. The irritation can come from substances that you are allergic to (allergens), such as poison ivy, chemicals, or medicines that were applied to your skin. Dyshidrotic eczema This is a form of eczema on the hands and feet. It shows up as very itchy, fluid-filled blisters. It can affect people of any age, but is more common before age 24. Hand eczema  This causes very itchy areas of skin on the palms and sides of  the hands and fingers. This type of eczema is common in industrial jobs where you may be exposed to many different types of irritants. Lichen simplex chronicus This type of eczema occurs when a person constantly scratches one area of the body. Repeated scratching of the area leads to thickened skin (lichenification). Lichen simplex chronicus can occur along with other types of eczema. It is more common in adults, but may be seen in children as well. Nummular eczema This is a common type of eczema. It has no known cause. It typically causes a red, circular, crusty lesion (plaque) that may be itchy. Scratching may become a habit and can cause bleeding. Nummular eczema occurs most often in people of middle-age or older. It most often affects the hands. Seborrheic dermatitis This is a common skin disease that mainly affects the scalp. It may also affect any oily areas of the body, such as the face, sides of nose, eyebrows, ears, eyelids, and chest. It is marked by small scaling and redness of the skin (erythema). This can affect people of all ages. In infants, this condition is known as Chartered certified accountant." Stasis dermatitis This is a common skin disease that usually appears on the legs and feet. It most often occurs in people who have a condition that prevents blood from being pumped through the veins in the legs (chronic venous insufficiency). Stasis dermatitis is a chronic condition that needs long-term management. How is eczema diagnosed? Your health care provider will examine your skin and review your medical  history. He or she may also give you skin patch tests. These tests involve taking patches that contain possible allergens and placing them on your back. He or she will then check in a few days to see if an allergic reaction occurred. What are the common treatments? Treatment for eczema is based on the type of eczema you have. Hydrocortisone steroid medicine can relieve itching quickly and help reduce  inflammation. This medicine may be prescribed or obtained over-the-counter, depending on the strength of the medicine that is needed. Follow these instructions at home:  Take over-the-counter and prescription medicines only as told by your health care provider.  Use creams or ointments to moisturize your skin. Do not use lotions.  Learn what triggers or irritates your symptoms. Avoid these things.  Treat symptom flare-ups quickly.  Do not itch your skin. This can make your rash worse.  Keep all follow-up visits as told by your health care provider. This is important. Where to find more information  The American Academy of Dermatology: http://jones-macias.info/  The National Eczema Association: www.nationaleczema.org Contact a health care provider if:  You have serious itching, even with treatment.  You regularly scratch your skin until it bleeds.  Your rash looks different than usual.  Your skin is painful, swollen, or more red than usual.  You have a fever. Summary  There are eight general types of eczema. Each type has different triggers.  Eczema of any type causes itching that may range from mild to severe.  Treatment varies based on the type of eczema you have. Hydrocortisone steroid medicine can help with itching and inflammation.  Protecting your skin is the best way to prevent eczema. Use moisturizers and lotions. Avoid triggers and irritants, and treat flare-ups quickly. This information is not intended to replace advice given to you by your health care provider. Make sure you discuss any questions you have with your health care provider. Document Revised: 07/09/2017 Document Reviewed: 12/10/2016 Elsevier Patient Education  [place]. Preventing Hypertension Hypertension, commonly called high blood pressure, is when the force of blood pumping through the arteries is too strong. Arteries are blood vessels that carry blood from the heart throughout the body. Over time,  hypertension can damage the arteries and decrease blood flow to important parts of the body, including the brain, heart, and kidneys. Often, hypertension does not cause symptoms until blood pressure is very high. For this reason, it is important to have your blood pressure checked on a regular basis. Hypertension can often be prevented with diet and lifestyle changes. If you already have hypertension, you can control it with diet and lifestyle changes, as well as medicine. What nutrition changes can be made? Maintain a healthy diet. This includes:  Eating less salt (sodium). Ask your health care provider how much sodium is safe for you to have. The general recommendation is to consume less than 1 tsp (2,300 mg) of sodium a day. ? Do not add salt to your food. ? Choose low-sodium options when grocery shopping and eating out.  Limiting fats in your diet. You can do this by eating low-fat or fat-free dairy products and by eating less red meat.  Eating more fruits, vegetables, and whole grains. Make a goal to eat: ? 1-2 cups of fresh fruits and vegetables each day. ? 3-4 servings of whole grains each day.  Avoiding foods and beverages that have added sugars.  Eating fish that contain healthy fats (omega-3 fatty acids), such as mackerel or salmon. If  you need help putting together a healthy eating plan, try the DASH diet. This diet is high in fruits, vegetables, and whole grains. It is low in sodium, red meat, and added sugars. DASH stands for Dietary Approaches to Stop Hypertension. What lifestyle changes can be made?   Lose weight if you are overweight. Losing just 3?5% of your body weight can help prevent or control hypertension. ? For example, if your present weight is 200 lb (91 kg), a loss of 3-5% of your weight means losing 6-10 lb (2.7-4.5 kg). ? Ask your health care provider to help you with a diet and exercise plan to safely lose weight.  Get enough exercise. Do at least 150 minutes  of moderate-intensity exercise each week. ? You could do this in short exercise sessions several times a day, or you could do longer exercise sessions a few times a week. For example, you could take a brisk 10-minute walk or bike ride, 3 times a day, for 5 days a week.  Find ways to reduce stress, such as exercising, meditating, listening to music, or taking a yoga class. If you need help reducing stress, ask your health care provider.  Do not smoke. This includes e-cigarettes. Chemicals in tobacco and nicotine products raise your blood pressure each time you smoke. If you need help quitting, ask your health care provider.  Avoid alcohol. If you drink alcohol, limit alcohol intake to no more than 1 drink a day for nonpregnant women and 2 drinks a day for men. One drink equals 12 oz of beer, 5 oz of wine, or 1 oz of hard liquor. Why are these changes important? Diet and lifestyle changes can help you prevent hypertension, and they may make you feel better overall and improve your quality of life. If you have hypertension, making these changes will help you control it and help prevent major complications, such as:  Hardening and narrowing of arteries that supply blood to: ? Your heart. This can cause a heart attack. ? Your brain. This can cause a stroke. ? Your kidneys. This can cause kidney failure.  Stress on your heart muscle, which can cause heart failure. What can I do to lower my risk?  Work with your health care provider to make a hypertension prevention plan that works for you. Follow your plan and keep all follow-up visits as told by your health care provider.  Learn how to check your blood pressure at home. Make sure that you know your personal target blood pressure, as told by your health care provider. How is this treated? In addition to diet and lifestyle changes, your health care provider may recommend medicines to help lower your blood pressure. You may need to try a few  different medicines to find what works best for you. You also may need to take more than one medicine. Take over-the-counter and prescription medicines only as told by your health care provider. Where to find support Your health care provider can help you prevent hypertension and help you keep your blood pressure at a healthy level. Your local hospital or your community may also provide support services and prevention programs. The American Heart Association offers an online support network at: CheapBootlegs.com.cy Where to find more information Learn more about hypertension from:  [place], Lung, and Blood Institute: ElectronicHangman.is  Centers for Disease Control and Prevention: https://ingram.com/  American Academy of Family Physicians: http://familydoctor.org/familydoctor/en/diseases-conditions/high-blood-pressure.printerview.all.html Learn more about the DASH diet from:  [place], Lung, and Blood Institute: https://www.reyes.com/ Contact  a health care provider if:  You think you are having a reaction to medicines you have taken.  You have recurrent headaches or feel dizzy.  You have swelling in your ankles.  You have trouble with your vision. Summary  Hypertension often does not cause any symptoms until blood pressure is very high. It is important to get your blood pressure checked regularly.  Diet and lifestyle changes are the most important steps in preventing hypertension.  By keeping your blood pressure in a healthy range, you can prevent complications like heart attack, heart failure, stroke, and kidney failure.  Work with your health care provider to make a hypertension prevention plan that works for you. This information is not intended to replace advice given to you by your health care provider. Make sure you discuss any questions you have with your health care  provider. Document Revised: 11/18/2018 Document Reviewed: 04/06/2016 Elsevier Patient Education  2020 [employer].

## 2020-03-08 ENCOUNTER — Telehealth (INDEPENDENT_AMBULATORY_CARE_PROVIDER_SITE_OTHER): Payer: Self-pay | Admitting: Internal Medicine

## 2020-03-08 ENCOUNTER — Ambulatory Visit (INDEPENDENT_AMBULATORY_CARE_PROVIDER_SITE_OTHER): Payer: Self-pay

## 2020-03-08 ENCOUNTER — Ambulatory Visit: Payer: Self-pay

## 2020-03-08 ENCOUNTER — Encounter: Payer: Self-pay | Admitting: Internal Medicine

## 2020-03-08 ENCOUNTER — Other Ambulatory Visit: Payer: Self-pay

## 2020-03-08 DIAGNOSIS — R102 Pelvic and perineal pain: Secondary | ICD-10-CM

## 2020-03-08 DIAGNOSIS — G8929 Other chronic pain: Secondary | ICD-10-CM

## 2020-03-08 DIAGNOSIS — L0293 Carbuncle, unspecified: Secondary | ICD-10-CM

## 2020-03-08 DIAGNOSIS — M542 Cervicalgia: Secondary | ICD-10-CM

## 2020-03-08 DIAGNOSIS — Z7689 Persons encountering health services in other specified circumstances: Secondary | ICD-10-CM

## 2020-03-08 MED ORDER — DOXYCYCLINE HYCLATE 100 MG PO TABS: 100.0000 mg | ORAL_TABLET | Freq: Every day | ORAL | 0 refills | Status: DC
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
## 2020-03-18 ENCOUNTER — Telehealth: Payer: Self-pay

## 2020-03-18 NOTE — Telephone Encounter (Signed)
Called patient to do their pre-visit COVID screening.  Call went to voicemail, unable to do prescreening.

## 2020-03-19 ENCOUNTER — Encounter: Payer: Self-pay | Admitting: Internal Medicine

## 2020-04-17 NOTE — Patient Instructions (Addendum)
[facility] Department Contact Information  Phone Fax Address  [phone] [address]  [id number] [place] [address]     Keeping You Healthy  Get These Tests 1. Blood Pressure- Have your blood pressure checked once a year by your health care provider.  Normal blood pressure is 120/80. 2. Weight- Have your body mass index (BMI) calculated to screen for obesity.  BMI is measure of body fat based on height and weight.  You can also calculate your own BMI at GravelBags.it. 3. Cholesterol- Have your cholesterol checked every 5 years starting at age 62 then yearly starting at age 4. 36. Chlamydia, HIV, and other sexually transmitted diseases- Get screened every year until age 33, then within three months of each new sexual provider. 5. Pap Test - Every 1-5 years; discuss with your health care provider. 6. Mammogram- Every 1-2 years starting at age 61--50  Take these medicines  Calcium with Vitamin D-Your body needs 1200 mg of Calcium each day and [phone] IU of Vitamin D daily.  Your body can only absorb 500 mg of Calcium at a time so Calcium must be taken in 2 or 3 divided doses throughout the day.  Multivitamin with folic acid- Once daily if it is possible for you to become pregnant.  Get these Immunizations  Gardasil-Series of three doses; prevents HPV related illness such as genital warts and cervical cancer.  Menactra-Single dose; prevents meningitis.  Tetanus shot- Every 10 years.  Flu shot-Every year.  Take these steps 1. Do not smoke-Your healthcare provider can help you quit.  For tips on how to quit go to www.smokefree.gov or call 1-800 QUITNOW. 2. Be physically active- Exercise 5 days a week for at least 30 minutes.  If you are not already physically active, start slow and gradually work up to 30 minutes of moderate physical activity.  Examples of moderate activity include walking briskly,  dancing, swimming, bicycling, etc. 3. Breast Cancer- A self breast exam every month is important for early detection of breast cancer.  For more information and instruction on self breast exams, ask your healthcare provider or https://www.patel.info/. 4. Eat a healthy diet- Eat a variety of healthy foods such as fruits, vegetables, whole grains, low fat milk, low fat cheeses, yogurt, lean meats, poultry and fish, beans, nuts, tofu, etc.  For more information go to www. Thenutritionsource.org 5. Drink alcohol in moderation- Limit alcohol intake to one drink or less per day. Never drink and drive. 6. Depression- Your emotional health is as important as your physical health.  If you're feeling down or losing interest in things you normally enjoy please talk to your healthcare provider about being screened for depression. 7. Dental visit- Brush and floss your teeth twice daily; visit your dentist twice a year. 8. Eye doctor- Get an eye exam at least every 2 years. 9. Helmet use- Always wear a helmet when riding a bicycle, motorcycle, rollerblading or skateboarding. 65. Safe sex- If you may be exposed to sexually transmitted infections, use a condom. 11. Seat belts- Seat belts can save your live; always wear one. 12. Smoke/Carbon Monoxide detectors- These detectors need to be installed on the appropriate level of your home. Replace batteries at least once a year. 13. Skin cancer- When out in the sun please cover up and use sunscreen 15 SPF or higher. 14. Violence- If anyone is threatening or hurting you, please tell your healthcare provider

## 2020-04-18 ENCOUNTER — Ambulatory Visit (INDEPENDENT_AMBULATORY_CARE_PROVIDER_SITE_OTHER): Payer: Self-pay | Admitting: Internal Medicine

## 2020-04-18 ENCOUNTER — Other Ambulatory Visit (HOSPITAL_COMMUNITY)
Admission: RE | Admit: 2020-04-18 | Discharge: 2020-04-18 | Disposition: A | Discharge status: Home / Self Care | Payer: Self-pay | Source: Ambulatory Visit | Attending: Internal Medicine | Admitting: Internal Medicine

## 2020-04-18 ENCOUNTER — Other Ambulatory Visit: Payer: Self-pay

## 2020-04-18 ENCOUNTER — Encounter: Payer: Self-pay | Admitting: Internal Medicine

## 2020-04-18 VITALS — BP 147/82 | HR 90 | Temp 97.5°F | Resp 17 | Ht 67.0 in | Wt 159.0 lb

## 2020-04-18 DIAGNOSIS — Z124 Encounter for screening for malignant neoplasm of cervix: Secondary | ICD-10-CM

## 2020-04-18 DIAGNOSIS — Z113 Encounter for screening for infections with a predominantly sexual mode of transmission: Secondary | ICD-10-CM

## 2020-04-18 DIAGNOSIS — Z0000 Encounter for general adult medical examination without abnormal findings: Secondary | ICD-10-CM

## 2020-04-18 DIAGNOSIS — G8929 Other chronic pain: Secondary | ICD-10-CM

## 2020-04-18 DIAGNOSIS — M542 Cervicalgia: Secondary | ICD-10-CM

## 2020-04-18 DIAGNOSIS — Z1231 Encounter for screening mammogram for malignant neoplasm of breast: Secondary | ICD-10-CM

## 2020-04-18 DIAGNOSIS — R399 Unspecified symptoms and signs involving the genitourinary system: Secondary | ICD-10-CM

## 2020-04-18 MED ORDER — CYCLOBENZAPRINE HCL 10 MG PO TABS: 10.0000 mg | ORAL_TABLET | Freq: Three times a day (TID) | ORAL | 0 refills | Status: DC | PRN

## 2020-04-18 MED ORDER — MELOXICAM 7.5 MG PO TABS: 7.5000 mg | ORAL_TABLET | Freq: Every day | ORAL | 0 refills | Status: DC

## 2020-04-18 NOTE — Progress Notes (Signed)
Subjective:    [facility] - 39 y.o. female MRN [id number]  Date of birth: 10/03/1980  HPI  Virginia Perez is here for annual exam. Has concerns about urinary frequency with small amounts of urine passed. Denies dysuria. No hematuria. Some very low sided back pain. This has been occurring for a couple weeks.      Health Maintenance:  Health Maintenance Due  Topic Date Due  . COVID-19 Vaccine (1) Never done  . MAMMOGRAM  Never done  . PAP SMEAR-Modifier  04/30/2015    -  reports that she has been smoking cigarettes. She has been smoking about 0.25 packs per day. She uses smokeless tobacco. - Review of Systems: Per HPI. - Past Medical History: There are no problems to display for this patient.  - Medications: reviewed and updated   Objective:   Physical Exam BP (!) 147/82   Pulse 90   Temp (!) 97.5 F (36.4 C) (Temporal)   Resp 17   Ht 5\' 7"  (1.702 m)   Wt 159 lb (72.1 kg)   SpO2 98%   BMI 24.90 kg/m  Physical Exam Constitutional:      Appearance: She is not diaphoretic.  HENT:     Head: Normocephalic and atraumatic.  Eyes:     Conjunctiva/sclera: Conjunctivae normal.     Pupils: Pupils are equal, round, and reactive to light.  Neck:     Thyroid: No thyromegaly.  Cardiovascular:     Rate and Rhythm: Normal rate and regular rhythm.     Heart sounds: Normal heart sounds. No murmur heard.   Pulmonary:     Effort: Pulmonary effort is normal. No respiratory distress.     Breath sounds: Normal breath sounds. No wheezing.  Abdominal:     General: Bowel sounds are normal. There is no distension.     Palpations: Abdomen is soft.     Tenderness: There is no abdominal tenderness. There is no guarding or rebound.  Genitourinary:    Comments: GU/GYN: Exam performed in the presence of a chaperone. External genitalia within normal limits.  Vaginal mucosa pink, moist, normal rugae.  Nonfriable cervix without lesions, no discharge or bleeding noted on speculum exam.   Bimanual exam revealed normal, nongravid uterus.  No cervical motion tenderness. No adnexal masses bilaterally.   Musculoskeletal:        General: No deformity. Normal range of motion.     Cervical back: Normal range of motion and neck supple.     Comments: No CVA tenderness.   Lymphadenopathy:     Cervical: No cervical adenopathy.  Skin:    General: Skin is warm and dry.     Findings: No rash.  Neurological:     Mental Status: She is alert and oriented to person, place, and time.     Gait: Gait is intact.  Psychiatric:        Mood and Affect: Mood and affect normal.        Judgment: Judgment normal.            Assessment & Plan:   1. Annual physical exam Counseled on 150 minutes of exercise per week, healthy eating (including decreased daily intake of saturated fats, cholesterol, added sugars, sodium), STI prevention, routine healthcare maintenance. - CBC with Differential - Comprehensive metabolic panel - Lipid Panel  2. Pap smear for cervical cancer screening - Cytology - PAP()  3. Screening for STDs (sexually transmitted diseases) - Cervicovaginal ancillary only - HIV Antibody (routine  testing w rflx) - RPR  4. Breast cancer screening by mammogram - MS DIGITAL SCREENING BILATERAL; Future  5. UTI symptoms - Urinalysis, Routine w reflex microscopic - Urine Culture  6. Chronic neck pain Xray showed some mild degenerative disc disease at C4-5 and C5-6. Vertebral height was maintained. Referral to PT is pending. Pain control with muscle relaxer and NSAID.  - cyclobenzaprine (FLEXERIL) 10 MG tablet; Take 1 tablet (10 mg total) by mouth 3 (three) times daily as needed for muscle spasms.  Dispense: 30 tablet; Refill: 0 - meloxicam (MOBIC) 7.5 MG tablet; Take 1 tablet (7.5 mg total) by mouth daily.  Dispense: 30 tablet; Refill: 0   Phill Myron, D.O. 04/18/2020, 10:54 AM Primary [facility] at [facility]

## 2020-04-19 ENCOUNTER — Encounter: Payer: Self-pay | Admitting: Obstetrics & Gynecology

## 2020-04-19 ENCOUNTER — Ambulatory Visit (INDEPENDENT_AMBULATORY_CARE_PROVIDER_SITE_OTHER): Payer: Self-pay | Admitting: Obstetrics & Gynecology

## 2020-04-19 VITALS — BP 130/82

## 2020-04-19 DIAGNOSIS — R102 Pelvic and perineal pain: Secondary | ICD-10-CM

## 2020-04-19 DIAGNOSIS — N920 Excessive and frequent menstruation with regular cycle: Secondary | ICD-10-CM

## 2020-04-19 DIAGNOSIS — Z310 Encounter for reversal of previous sterilization: Secondary | ICD-10-CM

## 2020-04-19 LAB — COMPREHENSIVE METABOLIC PANEL
ALT: 13 IU/L (ref 0–32)
AST: 22 IU/L (ref 0–40)
Albumin/Globulin Ratio: 1.5 (ref 1.2–2.2)
Albumin: 4.9 g/dL — ABNORMAL HIGH (ref 3.8–4.8)
Alkaline Phosphatase: 87 IU/L (ref 48–121)
BUN/Creatinine Ratio: 13 (ref 9–23)
BUN: 10 mg/dL (ref 6–24)
Bilirubin Total: 0.6 mg/dL (ref 0.0–1.2)
CO2: 22 mmol/L (ref 20–29)
Calcium: 9.8 mg/dL (ref 8.7–10.2)
Chloride: 102 mmol/L (ref 96–106)
Creatinine, Ser: 0.77 mg/dL (ref 0.57–1.00)
GFR calc Af Amer: 111 mL/min/{1.73_m2} (ref >59)
GFR calc non Af Amer: 96 mL/min/{1.73_m2} (ref >59)
Globulin, Total: 3.3 g/dL (ref 1.5–4.5)
Glucose: 111 mg/dL — ABNORMAL HIGH (ref 65–99)
Potassium: 3.6 mmol/L (ref 3.5–5.2)
Sodium: 140 mmol/L (ref 134–144)
Total Protein: 8.2 g/dL (ref 6.0–8.5)

## 2020-04-19 LAB — URINALYSIS, ROUTINE W REFLEX MICROSCOPIC
Bilirubin, UA: NEGATIVE
Glucose, UA: NEGATIVE
Leukocytes,UA: NEGATIVE
Nitrite, UA: NEGATIVE
RBC, UA: NEGATIVE
Specific Gravity, UA: >=1.03 — AB (ref 1.005–1.030)
Urobilinogen, Ur: 0.2 mg/dL (ref 0.2–1.0)
pH, UA: 5 (ref 5.0–7.5)

## 2020-04-19 LAB — HIV ANTIBODY (ROUTINE TESTING W REFLEX): HIV Screen 4th Generation wRfx: NONREACTIVE

## 2020-04-19 LAB — CBC WITH DIFFERENTIAL/PLATELET
Basophils Absolute: 0 10*3/uL (ref 0.0–0.2)
Basos: 0 %
EOS (ABSOLUTE): 0.1 10*3/uL (ref 0.0–0.4)
Eos: 3 %
Hematocrit: 37.5 % (ref 34.0–46.6)
Hemoglobin: 12.7 g/dL (ref 11.1–15.9)
Immature Grans (Abs): 0 10*3/uL (ref 0.0–0.1)
Immature Granulocytes: 0 %
Lymphocytes Absolute: 1 10*3/uL (ref 0.7–3.1)
Lymphs: 27 %
MCH: 32.7 pg (ref 26.6–33.0)
MCHC: 33.9 g/dL (ref 31.5–35.7)
MCV: 97 fL (ref 79–97)
Monocytes Absolute: 0.3 10*3/uL (ref 0.1–0.9)
Monocytes: 8 %
Neutrophils Absolute: 2.4 10*3/uL (ref 1.4–7.0)
Neutrophils: 62 %
Platelets: 231 10*3/uL (ref 150–450)
RBC: 3.88 x10E6/uL (ref 3.77–5.28)
RDW: 12.2 % (ref 11.7–15.4)
WBC: 3.8 10*3/uL (ref 3.4–10.8)

## 2020-04-19 LAB — LIPID PANEL
Chol/HDL Ratio: 3.8 ratio (ref 0.0–4.4)
Cholesterol, Total: 263 mg/dL — ABNORMAL HIGH (ref 100–199)
HDL: 70 mg/dL (ref >39)
LDL Chol Calc (NIH): 178 mg/dL — ABNORMAL HIGH (ref 0–99)
Triglycerides: 88 mg/dL (ref 0–149)
VLDL Cholesterol Cal: 15 mg/dL (ref 5–40)

## 2020-04-19 LAB — RPR: RPR Ser Ql: NONREACTIVE

## 2020-04-19 NOTE — Progress Notes (Signed)
    Virginia Perez 03/18/1981 [id number]        39 y.o.  G2P2L2  RP: Pelvic pain/cramping with worsening menorrhagia for counseling on Essure removal  HPI: Patient has had Essure since 2012.  Since then has experienced more pelvic cramping and heavier menses.  Lately the cramping has worsened and her menses have been heavier.  Patient feels that she is intolerant to Essure.  She has had facial acne and a persistent unexplained rash on her hands treated by dermatology.   OB History  Gravida Para Term Preterm AB Living  2 2       2   SAB TAB Ectopic Multiple Live Births               # Outcome Date GA Lbr Len/2nd Weight Sex Delivery Anes PTL Lv  2 Para           1 Para             Past medical history,surgical history, problem list, medications, allergies, family history and social history were all reviewed and documented in the EPIC chart.   Directed ROS with pertinent positives and negatives documented in the history of present illness/assessment and plan.  Exam:  Vitals:   04/19/20 0920  BP: 130/82   General appearance:  Normal  Abdomen: Normal  Gynecologic exam: Vulva normal.  Bimanual exam revealing a normal vagina and cervix.  Uterus anteverted, mobile, normal volume, nontender.  No adnexal mass, nontender bilaterally.   Assessment/Plan:  39 y.o. G2P2   1. Pelvic pain in female Pelvic pain and cramping worsening recently.  Will rule out thyroid dysfunction and anemia.  Rheumatoid factor added given patient's unexplained persistent rash on her hands and face.  Follow-up with a pelvic ultrasound to further investigate her pelvic pain and cramping. - TSH - CBC - Rheumatoid factor - US Transvaginal Non-OB; Future  2. Encounter for removal of Essure Patient would like removal of Essure which was put in place in 2012.  Patient suspects inflammation associated with Esher.  Patient informed that as sure is more difficult to remove then to put in place given the scarring  that occurs after insertion.  Therefore a bilateral salpingectomy would be needed and the Essure could be difficult to remove even then if a significant portion is inside the uterus.  A hysterectomy may need to be done.  Given that patient has also progressive menorrhagia and pelvic pain and cramping, will complete the investigation with a pelvic ultrasound.  We will decide on management when all the results are obtained. - US Transvaginal Non-OB; Future  3. Menorrhagia with regular cycle Menorrhagia progressing recently.  Rule out thyroid dysfunction and anemia.  Follow-up pelvic ultrasound to rule out uterine and endometrial pathology. - TSH - CBC - Rheumatoid factor - US Transvaginal Non-OB; Future  Princess Bruins MD, 9:37 AM 04/19/2020

## 2020-04-20 LAB — CBC
HCT: 36.1 % (ref 35.0–45.0)
Hemoglobin: 12 g/dL (ref 11.7–15.5)
MCH: 32.3 pg (ref 27.0–33.0)
MCHC: 33.2 g/dL (ref 32.0–36.0)
MCV: 97 fL (ref 80.0–100.0)
MPV: 11.3 fL (ref 7.5–12.5)
Platelets: 228 10*3/uL (ref 140–400)
RBC: 3.72 10*6/uL — ABNORMAL LOW (ref 3.80–5.10)
RDW: 12.1 % (ref 11.0–15.0)
WBC: 3.1 10*3/uL — ABNORMAL LOW (ref 3.8–10.8)

## 2020-04-20 LAB — RHEUMATOID FACTOR: Rheumatoid fact SerPl-aCnc: <14 IU/mL (ref <14)

## 2020-04-20 LAB — URINE CULTURE

## 2020-04-20 LAB — TSH: TSH: 0.47 mIU/L

## 2020-04-22 LAB — CYTOLOGY - PAP
Adequacy: ABSENT
Comment: NEGATIVE
Diagnosis: NEGATIVE
High risk HPV: NEGATIVE

## 2020-04-22 LAB — CERVICOVAGINAL ANCILLARY ONLY
Bacterial Vaginitis (gardnerella): POSITIVE — AB
Candida Glabrata: NEGATIVE
Candida Vaginitis: NEGATIVE
Chlamydia: NEGATIVE
Comment: NEGATIVE
Comment: NEGATIVE
Comment: NEGATIVE
Comment: NEGATIVE
Comment: NEGATIVE
Comment: NORMAL
Neisseria Gonorrhea: NEGATIVE
Trichomonas: NEGATIVE

## 2020-04-23 ENCOUNTER — Other Ambulatory Visit: Payer: Self-pay

## 2020-04-23 ENCOUNTER — Encounter: Payer: Self-pay | Admitting: Obstetrics & Gynecology

## 2020-04-23 ENCOUNTER — Ambulatory Visit (INDEPENDENT_AMBULATORY_CARE_PROVIDER_SITE_OTHER): Payer: Self-pay | Admitting: Obstetrics & Gynecology

## 2020-04-23 ENCOUNTER — Ambulatory Visit (INDEPENDENT_AMBULATORY_CARE_PROVIDER_SITE_OTHER): Payer: Self-pay

## 2020-04-23 DIAGNOSIS — R102 Pelvic and perineal pain: Secondary | ICD-10-CM

## 2020-04-23 DIAGNOSIS — N854 Malposition of uterus: Secondary | ICD-10-CM

## 2020-04-23 DIAGNOSIS — D252 Subserosal leiomyoma of uterus: Secondary | ICD-10-CM

## 2020-04-23 DIAGNOSIS — N920 Excessive and frequent menstruation with regular cycle: Secondary | ICD-10-CM

## 2020-04-23 DIAGNOSIS — D251 Intramural leiomyoma of uterus: Secondary | ICD-10-CM

## 2020-04-23 DIAGNOSIS — N8302 Follicular cyst of left ovary: Secondary | ICD-10-CM

## 2020-04-23 DIAGNOSIS — D219 Benign neoplasm of connective and other soft tissue, unspecified: Secondary | ICD-10-CM

## 2020-04-23 DIAGNOSIS — N945 Secondary dysmenorrhea: Secondary | ICD-10-CM

## 2020-04-23 DIAGNOSIS — Z310 Encounter for reversal of previous sterilization: Secondary | ICD-10-CM

## 2020-04-23 DIAGNOSIS — N8312 Corpus luteum cyst of left ovary: Secondary | ICD-10-CM

## 2020-04-23 NOTE — Progress Notes (Signed)
    Virginia Perez 05/02/1981 [id number]        39 y.o.  G2P2L2  RP: Pelvic Pain/Essure for Pelvic US  HPI: No change x last visit.  Patient seen on 04/19/2020 when we noted: Patient has had Essure since 2012.  Since then has experienced more pelvic cramping and heavier menses.  Lately the cramping has worsened and her menses have been heavier.  Patient feels that she is intolerant to Essure.  She has had facial acne and a persistent unexplained rash on her hands treated by dermatology.   OB History  Gravida Para Term Preterm AB Living  2 2       2   SAB TAB Ectopic Multiple Live Births               # Outcome Date GA Lbr Len/2nd Weight Sex Delivery Anes PTL Lv  2 Para           1 Para             Past medical history,surgical history, problem list, medications, allergies, family history and social history were all reviewed and documented in the EPIC chart.   Directed ROS with pertinent positives and negatives documented in the history of present illness/assessment and plan.  Exam:  There were no vitals filed for this visit. General appearance:  Normal  Pelvic US today: T/V images.  Anteverted uterus enlarged with myometrial masses, the largest is subserosal myoma measured at 4.1 x 3.2 cm located at the right anterior uterus.  The other fibroids are intramural.  Hyperechogenic spots are seen at the cornua bilaterally probably corresponding to the Essure devices.  The overall uterine size is measured at 9.56 x 5.65 x 4.26 cm.  The endometrial lining is normal and symmetrical on day 30 of the cycle measured at 5.83 mm with no mass or thickening seen.  Both ovaries are normal in size with a normal follicular pattern, a dominant follicle is present on the left ovary measured at 2 cm, as well as a collapsed corpus luteum measured at 1.6 mm.  No adnexal mass.  No free fluid in the posterior cul-de-sac.  Labs 04/19/2020: Hemoglobin normal at 12 and TSH normal.   Assessment/Plan:  39 y.o.  G2P2   1. Pelvic pain in female Pelvic pain with fibroids and menorrhagia in a patient status post tubal sterilization with Essure, presenting probable side effects of Essure and desiring removal.  Given the full clinical picture and the pelvic ultrasound findings, decision to proceed with XI Robotic TLH/Bilateral Salpingectomy.  Surgical approach and risks reviewed today.  Pamphlet given.  Patient will follow up for a preop visit.  2. Fibroids As above.  3. Menorrhagia with regular cycle Probably secondary to uterine fibroids.  Hemoglobin at 12.  Iron rich nutrition recommended.  4. Secondary dysmenorrhea As above.  5. Encounter for removal of Essure As above.  Princess Bruins MD, 10:12 AM 04/23/2020

## 2020-04-24 ENCOUNTER — Telehealth: Payer: Self-pay

## 2020-04-24 ENCOUNTER — Other Ambulatory Visit: Payer: Self-pay | Admitting: Internal Medicine

## 2020-04-24 MED ORDER — METRONIDAZOLE 500 MG PO TABS: 500.0000 mg | ORAL_TABLET | Freq: Two times a day (BID) | ORAL | 0 refills | Status: DC

## 2020-04-24 NOTE — Telephone Encounter (Signed)
Spoke with patient about scheduling Robotic TLH, BSE.  Patient does not currently have any insurance so we discussed her estimated pre-payment amount with the private pay discount for her [employer] prepayment due by week before surgery.  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 (I made her aware of pre-existing waivers so that she can ask about that as she shops ins plans.)  I advised her that I only have two block robot mornings a month and if she delays we might be scheduling in Jan when she calls back and she said that would be fine with her. I will wait to hear from her when she is ready to proceed.

## 2020-04-26 ENCOUNTER — Encounter: Payer: Self-pay | Admitting: Obstetrics & Gynecology

## 2020-04-26 NOTE — Progress Notes (Signed)
Patient notified of results & recommendations. Expressed understanding.

## 2020-05-06 ENCOUNTER — Telehealth: Payer: Self-pay

## 2020-05-06 ENCOUNTER — Other Ambulatory Visit: Payer: Self-pay

## 2020-05-06 DIAGNOSIS — N946 Dysmenorrhea, unspecified: Secondary | ICD-10-CM

## 2020-05-06 NOTE — Telephone Encounter (Signed)
Patient of Dr. Mariah Milling. Called in voice mail stating she is Day 3 of her menses and is having "excruciating pain this morning" that cause her to miss her work assignment today.  She said she does have this pain sometimes with her menses and this has been going on for some time. Not new.  She said she has talked with Dr. Marguerita Merles about it in the past.  She said that Ibuprofen 800mg  that she has for it is not touching it.  She asked if she needed to come in or if stronger Rx for pain could be sent.

## 2020-05-06 NOTE — Telephone Encounter (Signed)
You recommended an ultrasound next week. I noticed patient just had an u/s last week. Please advise. thanks

## 2020-05-06 NOTE — Telephone Encounter (Signed)
Patient called with pharmacy. Rx sent. I told her someone will call her tomorrow to schedule an u/s.  Staff message sent to [facility] to call her tomorrow and schedule.

## 2020-05-06 NOTE — Telephone Encounter (Signed)
Left message to let me know pharmacy.

## 2020-05-06 NOTE — Telephone Encounter (Signed)
Let's try Ultram 50 mg 1 tab PO every 8 hours as needed #20, no refill.  Schedule Pelvic US with me next week.

## 2020-05-07 NOTE — Telephone Encounter (Signed)
Yes, reviewed the chart.  Recent Pelvic US 04/23/2020.  Decision to proceed with XI Robotic Hysterectomy.  Make sure patient is scheduled soon for her Preop Visit.

## 2020-05-07 NOTE — Telephone Encounter (Signed)
I spoke with patient 04/24/20 and she has no insurance currently and cannot afford to have surgery currently.  She was shopping for insurance and plans to call me back.

## 2020-05-08 ENCOUNTER — Other Ambulatory Visit: Payer: Self-pay

## 2020-05-08 ENCOUNTER — Ambulatory Visit: Payer: Self-pay

## 2020-05-08 ENCOUNTER — Ambulatory Visit (INDEPENDENT_AMBULATORY_CARE_PROVIDER_SITE_OTHER): Payer: Self-pay | Admitting: Physician Assistant

## 2020-05-08 VITALS — BP 135/81 | HR 80 | Temp 98.3°F | Resp 18 | Ht 66.0 in | Wt 164.0 lb

## 2020-05-08 DIAGNOSIS — G8929 Other chronic pain: Secondary | ICD-10-CM

## 2020-05-08 DIAGNOSIS — D72819 Decreased white blood cell count, unspecified: Secondary | ICD-10-CM

## 2020-05-08 DIAGNOSIS — L0293 Carbuncle, unspecified: Secondary | ICD-10-CM

## 2020-05-08 DIAGNOSIS — R102 Pelvic and perineal pain: Secondary | ICD-10-CM

## 2020-05-08 DIAGNOSIS — R110 Nausea: Secondary | ICD-10-CM

## 2020-05-08 MED ORDER — ONDANSETRON HCL 8 MG PO TABS: 8.0000 mg | ORAL_TABLET | Freq: Three times a day (TID) | ORAL | 0 refills | Status: DC | PRN

## 2020-05-08 MED ORDER — SULFAMETHOXAZOLE-TRIMETHOPRIM 800-160 MG PO TABS: 1.0000 | ORAL_TABLET | Freq: Two times a day (BID) | ORAL | 0 refills | Status: AC

## 2020-05-08 NOTE — Progress Notes (Signed)
Established Patient Office Visit  Subjective:  Patient ID: Virginia Perez, female    DOB: 08-Jul-1981  Age: 39 y.o. MRN: [id number]  CC:  Chief Complaint  Patient presents with  . Pelvic Pain    HPI AALEEYAH BIAS reports that she has been seeing gynecology, had an ultrasound and it was recommended that she proceed with a hysterectomy to help resolve her pain issues.  Reports that she is working on [employer] so she can proceed with this.  Reports that she did start a menses and has been having severe pain.  Reports that she has tried ibuprofen without relief.  Reports that she has been experiencing nausea due to the pain, did have one episode of vomiting last week, otherwise is able to keep food and beverage down.  Reports that she did have an appointment with dermatology for her chronic skin condition, states that she was given several different types of creams to sample without relief.  Reports that she does not have an upcoming appointment with them due to financial constraints.  Reports that she continues to have several purulent boils on her face and neck.  Reports when she was previously prescribed Bactrim it did offer relief.  Reports that she has been working to keep areas clean and dry.  History reviewed. No pertinent past medical history.  Past Surgical History:  Procedure Laterality Date  . CESAREAN SECTION    . hemorrhoidectomy    . TUBAL LIGATION  2012  . WISDOM TOOTH EXTRACTION      Family History  Problem Relation Age of Onset  . Hypertension Mother   . Hypertension Father   . Hypertension Maternal Grandmother     Social History   Socioeconomic History  . Marital status: Single    Spouse name: Not on file  . Number of children: Not on file  . Years of education: Not on file  . Highest education level: Not on file  Occupational History  . Not on file  Tobacco Use  . Smoking status: Current Some Day Smoker    Packs/day: 0.25    Types:  Cigarettes  . Smokeless tobacco: Current User  Vaping Use  . Vaping Use: Never used  Substance and Sexual Activity  . Alcohol use: No  . Drug use: No  . Sexual activity: Yes    Partners: Male    Birth control/protection: None, Surgical    Comment: 1st intercourse- 15, partners- 65, current partner - 12 yrs   Other Topics Concern  . Not on file  Social History Narrative  . Not on file   Social Determinants of Health   Financial Resource Strain:   . Difficulty of Paying Living Expenses: Not on file  Food Insecurity:   . Worried About Charity fundraiser in the Last Year: Not on file  . Ran Out of Food in the Last Year: Not on file  Transportation Needs:   . Lack of Transportation (Medical): Not on file  . Lack of Transportation (Non-Medical): Not on file  Physical Activity:   . Days of Exercise per Week: Not on file  . Minutes of Exercise per Session: Not on file  Stress:   . Feeling of Stress : Not on file  Social Connections:   . Frequency of Communication with Friends and Family: Not on file  . Frequency of Social Gatherings with Friends and Family: Not on file  . Attends Religious Services: Not on file  . Active Member of  Clubs or Organizations: Not on file  . Attends Archivist Meetings: Not on file  . Marital Status: Not on file  Intimate Partner Violence:   . Fear of Current or Ex-Partner: Not on file  . Emotionally Abused: Not on file  . Physically Abused: Not on file  . Sexually Abused: Not on file    Outpatient Medications Prior to Visit  Medication Sig Dispense Refill  . Multiple Vitamin (MULTIVITAMIN) capsule Take 1 capsule by mouth daily.    . traMADol (ULTRAM) 50 MG tablet Take 50 mg by mouth every 8 (eight) hours as needed.    . metroNIDAZOLE (FLAGYL) 500 MG tablet Take 1 tablet (500 mg total) by mouth 2 (two) times daily. 14 tablet 0   No facility-administered medications prior to visit.    Allergies  Allergen Reactions  . Prednisone  Swelling    ROS Review of Systems  Constitutional: Negative for chills and fever.  HENT: Negative.   Eyes: Negative.   Respiratory: Negative.   Cardiovascular: Negative.   Gastrointestinal: Positive for abdominal pain, nausea and vomiting.  Endocrine: Negative.   Genitourinary: Positive for vaginal bleeding and vaginal pain. Negative for vaginal discharge.  Musculoskeletal: Negative.   Skin: Positive for rash.  Allergic/Immunologic: Negative.   Neurological: Negative.   Hematological: Negative.   Psychiatric/Behavioral: Negative.       Objective:    Physical Exam Vitals and nursing note reviewed.  Constitutional:      Appearance: Normal appearance.  HENT:     Head: Normocephalic and atraumatic.     Right Ear: External ear normal.     Left Ear: External ear normal.     Nose: Nose normal.     Mouth/Throat:     Mouth: Mucous membranes are moist.     Pharynx: Oropharynx is clear.  Eyes:     Conjunctiva/sclera: Conjunctivae normal.     Pupils: Pupils are equal, round, and reactive to light.  Cardiovascular:     Rate and Rhythm: Normal rate and regular rhythm.     Pulses: Normal pulses.  Pulmonary:     Effort: Pulmonary effort is normal.     Breath sounds: Normal breath sounds.  Abdominal:     General: Abdomen is flat. Bowel sounds are normal.     Palpations: Abdomen is soft.     Tenderness: There is generalized abdominal tenderness.  Musculoskeletal:        General: Normal range of motion.     Cervical back: Normal range of motion and neck supple.  Skin:    General: Skin is warm and dry.       Neurological:     General: No focal deficit present.     Mental Status: She is alert and oriented to person, place, and time.  Psychiatric:        Mood and Affect: Mood normal.        Behavior: Behavior normal.        Thought Content: Thought content normal.        Judgment: Judgment normal.     BP 135/81 (BP Location: Right Arm, Patient Position: Sitting, Cuff  Size: Normal)   Pulse 80   Temp 98.3 F (36.8 C) (Oral)   Resp 18   Ht 5\' 6"  (1.676 m)   Wt 164 lb (74.4 kg)   LMP 05/05/2020   SpO2 99%   BMI 26.47 kg/m  Wt Readings from Last 3 Encounters:  05/08/20 164 lb (74.4 kg)  04/18/20 159 lb (  72.1 kg)  01/31/20 163 lb (73.9 kg)     Health Maintenance Due  Topic Date Due  . COVID-19 Vaccine (1) Never done  . MAMMOGRAM  Never done    There are no preventive care reminders to display for this patient.  Lab Results  Component Value Date   TSH 0.47 04/19/2020   Lab Results  Component Value Date   WBC 5.5 05/08/2020   HGB 12.3 05/08/2020   HCT 34.9 05/08/2020   MCV 95 05/08/2020   PLT 267 05/08/2020   Lab Results  Component Value Date   NA 140 04/18/2020   K 3.6 04/18/2020   CO2 22 04/18/2020   GLUCOSE 111 (H) 04/18/2020   BUN 10 04/18/2020   CREATININE 0.77 04/18/2020   BILITOT 0.6 04/18/2020   ALKPHOS 87 04/18/2020   AST 22 04/18/2020   ALT 13 04/18/2020   PROT 8.2 04/18/2020   ALBUMIN 4.9 (H) 04/18/2020   CALCIUM 9.8 04/18/2020   ANIONGAP 7 02/12/2019   Lab Results  Component Value Date   CHOL 263 (H) 04/18/2020   Lab Results  Component Value Date   HDL 70 04/18/2020   Lab Results  Component Value Date   LDLCALC 178 (H) 04/18/2020   Lab Results  Component Value Date   TRIG 88 04/18/2020   Lab Results  Component Value Date   CHOLHDL 3.8 04/18/2020   Lab Results  Component Value Date   HGBA1C 5.0 01/31/2020      Assessment & Plan:   Problem List Items Addressed This Visit    None    Visit Diagnoses    Chronic pelvic pain in female    -  Primary   Relevant Medications   traMADol (ULTRAM) 50 MG tablet   Recurrent boils       Relevant Medications   sulfamethoxazole-trimethoprim (BACTRIM DS) 800-160 MG tablet   Leukopenia, unspecified type       Relevant Orders   CBC with Differential/Platelet (Completed)   Nausea without vomiting       Relevant Medications   ondansetron (ZOFRAN) 8 MG  tablet      Meds ordered this encounter  Medications  . sulfamethoxazole-trimethoprim (BACTRIM DS) 800-160 MG tablet    Sig: Take 1 tablet by mouth 2 (two) times daily for 5 days.    Dispense:  10 tablet    Refill:  0    Order Specific Question:   Supervising Provider    Answer:   Joya Gaskins, PATRICK E [[id number]]  . ondansetron (ZOFRAN) 8 MG tablet    Sig: Take 1 tablet (8 mg total) by mouth every 8 (eight) hours as needed for nausea or vomiting.    Dispense:  20 tablet    Refill:  0    Order Specific Question:   Supervising Provider    Answer:   WRIGHT, PATRICK E [[id number]]  1. Chronic pelvic pain in female  T/V images. Anteverted uterus enlarged with myometrial masses, the largest is subserosal myoma measured at 4.1 x 3.2 cm located at the right anterior uterus. The other fibroids are intramural. Hyperechogenic spotsare seen at the cornua bilaterally probably corresponding to the Essure devices. The overall uterine size is measured at 9.56 x 5.65 x 4.26 cm. The endometrial lining is normal and symmetrical on day 30 of the cycle measured at 5.83 mm with no mass or thickening seen. Both ovaries are normal in size with a normal follicular pattern, a dominant follicle is present on the left ovary measured at  2 cm, as well as a collapsed corpus luteum measured at 1.6 mm. No adnexal mass. No free fluid in the posterior cul-de-sac.  Patient did have prescription for Ultram sent to her pharmacy from gynecology, patient had not picked this up yet.  Encourage patient to continue follow-up with gynecology, trial Ultram    2. Recurrent boils Trial Bactrim, increase hydration - sulfamethoxazole-trimethoprim (BACTRIM DS) 800-160 MG tablet; Take 1 tablet by mouth 2 (two) times daily for 5 days.  Dispense: 10 tablet; Refill: 0  3. Leukopenia, unspecified type Lab completed on behalf of gynecology, results sent to their office - CBC with Differential/Platelet  4. Nausea without vomiting Trial  Zofran - ondansetron (ZOFRAN) 8 MG tablet; Take 1 tablet (8 mg total) by mouth every 8 (eight) hours as needed for nausea or vomiting.  Dispense: 20 tablet; Refill: 0   I have reviewed the patient's medical history (PMH, PSH, Social History, Family History, Medications, and allergies) , and have been updated if relevant. I spent 30 minutes reviewing chart and  face to face time with patient.    Follow-up: Return if symptoms worsen or fail to improve.    Loraine Grip Mayers, PA-C

## 2020-05-08 NOTE — Patient Instructions (Signed)
I encourage you to take the Ultram as directed from GYN, you can also use the Zofran to help with nausea.    I encourage you to maintain follow up with gynecology, so they can help you resolve this issue.    It was a pleasure to see you again, take care  Kennieth Rad, PA-C Physician Assistant [place] [email or web address]    Nausea, Adult Nausea is the feeling that you have an upset stomach or that you are about to vomit. Nausea on its own is not usually a serious concern, but it may be an early sign of a more serious medical problem. As nausea gets worse, it can lead to vomiting. If vomiting develops, or if you are not able to drink enough fluids, you are at risk of becoming dehydrated. Dehydration can make you tired and thirsty, cause you to have a dry mouth, and decrease how often you urinate. Older adults and people with other diseases or a weak disease-fighting system (immune system) are at higher risk for dehydration. The main goals of treating your nausea are:  To relieve your nausea.  To limit repeated nausea episodes.  To prevent vomiting and dehydration. Follow these instructions at home: Watch your symptoms for any changes. Tell your health care provider about them. Follow these instructions as told by your health care provider. Eating and drinking      Take an oral rehydration solution (ORS). This is a drink that is sold at pharmacies and retail stores.  Drink clear fluids slowly and in small amounts as you are able. Clear fluids include water, ice chips, low-calorie sports drinks, and fruit juice that has water added (diluted fruit juice).  Eat bland, easy-to-digest foods in small amounts as you are able. These foods include bananas, applesauce, rice, lean meats, toast, and crackers.  Avoid drinking fluids that contain a lot of sugar or caffeine, such as energy drinks, sports drinks, and soda.  Avoid  alcohol.  Avoid spicy or fatty foods. General instructions  Take over-the-counter and prescription medicines only as told by your health care provider.  Rest at home while you recover.  Drink enough fluid to keep your urine pale yellow.  Breathe slowly and deeply when you feel nauseous.  Avoid smelling things that have strong odors.  Wash your hands often using soap and water. If soap and water are not available, use hand sanitizer.  Make sure that all people in your household wash their hands well and often.  Keep all follow-up visits as told by your health care provider. This is important. Contact a health care provider if:  Your nausea gets worse.  Your nausea does not go away after two days.  You vomit.  You cannot drink fluids without vomiting.  You have any of the following: ? New symptoms. ? A fever. ? A headache. ? Muscle cramps. ? A rash. ? Pain while urinating.  You feel light-headed or dizzy. Get help right away if:  You have pain in your chest, neck, arm, or jaw.  You feel extremely weak or you faint.  You have vomit that is bright red or looks like coffee grounds.  You have bloody or black stools or stools that look like tar.  You have a severe headache, a stiff neck, or both.  You have severe pain, cramping, or bloating in your abdomen.  You have difficulty breathing or are breathing very quickly.  Your heart is beating very quickly.  Your skin feels cold  and clammy.  You feel confused.  You have signs of dehydration, such as: ? Dark urine, very little urine, or no urine. ? Cracked lips. ? Dry mouth. ? Sunken eyes. ? Sleepiness. ? Weakness. These symptoms may represent a serious problem that is an emergency. Do not wait to see if the symptoms will go away. Get medical help right away. Call your local emergency services (911 in the [place] Do not drive yourself to the hospital. Summary  Nausea is the feeling that you have an upset  stomach or that you are about to vomit. Nausea on its own is not usually a serious concern, but it may be an early sign of a more serious medical problem.  If vomiting develops, or if you are not able to drink enough fluids, you are at risk of becoming dehydrated.  Follow recommendations for eating and drinking and take over-the-counter and prescription medicines only as told by your health care provider.  Contact a health care provider right away if your symptoms worsen or you have new symptoms.  Keep all follow-up visits as told by your health care provider. This is important. This information is not intended to replace advice given to you by your health care provider. Make sure you discuss any questions you have with your health care provider. Document Revised: 01/04/2018 Document Reviewed: 01/04/2018 Elsevier Patient Education  [place].

## 2020-05-09 ENCOUNTER — Telehealth: Payer: Self-pay | Admitting: *Deleted

## 2020-05-09 LAB — CBC WITH DIFFERENTIAL/PLATELET
Basophils Absolute: 0 10*3/uL (ref 0.0–0.2)
Basos: 1 %
EOS (ABSOLUTE): 0.4 10*3/uL (ref 0.0–0.4)
Eos: 7 %
Hematocrit: 34.9 % (ref 34.0–46.6)
Hemoglobin: 12.3 g/dL (ref 11.1–15.9)
Immature Grans (Abs): 0 10*3/uL (ref 0.0–0.1)
Immature Granulocytes: 0 %
Lymphocytes Absolute: 1.4 10*3/uL (ref 0.7–3.1)
Lymphs: 25 %
MCH: 33.6 pg — ABNORMAL HIGH (ref 26.6–33.0)
MCHC: 35.2 g/dL (ref 31.5–35.7)
MCV: 95 fL (ref 79–97)
Monocytes Absolute: 0.4 10*3/uL (ref 0.1–0.9)
Monocytes: 8 %
Neutrophils Absolute: 3.3 10*3/uL (ref 1.4–7.0)
Neutrophils: 59 %
Platelets: 267 10*3/uL (ref 150–450)
RBC: 3.66 x10E6/uL — ABNORMAL LOW (ref 3.77–5.28)
RDW: 11.8 % (ref 11.7–15.4)
WBC: 5.5 10*3/uL (ref 3.4–10.8)

## 2020-05-09 NOTE — Telephone Encounter (Signed)
-----   Message from Kennieth Rad, [place] sent at 05/09/2020  9:51 AM EDT ----- Please call patient and let her know that her white blood cell count was back within normal limits.  Please let her know that we will also forward this lab result to gynecology

## 2020-05-09 NOTE — Telephone Encounter (Signed)
Patient verified DOB Patient is aware of WBC returning to normal limits and provider forwarding the information to gyn.

## 2020-05-15 ENCOUNTER — Ambulatory Visit: Payer: Self-pay

## 2020-05-20 ENCOUNTER — Ambulatory Visit: Payer: Self-pay

## 2020-07-03 ENCOUNTER — Other Ambulatory Visit: Payer: Self-pay

## 2020-07-03 DIAGNOSIS — L0293 Carbuncle, unspecified: Secondary | ICD-10-CM

## 2020-07-03 MED ORDER — DOXYCYCLINE HYCLATE 100 MG PO TABS: 100.0000 mg | ORAL_TABLET | Freq: Every day | ORAL | 0 refills | Status: DC

## 2020-10-16 ENCOUNTER — Other Ambulatory Visit: Payer: Self-pay

## 2020-10-16 ENCOUNTER — Telehealth: Payer: Self-pay | Admitting: Internal Medicine

## 2021-06-02 ENCOUNTER — Other Ambulatory Visit: Payer: Self-pay

## 2021-06-02 ENCOUNTER — Ambulatory Visit
Admission: EM | Admit: 2021-06-02 | Discharge: 2021-06-02 | Disposition: A | Discharge status: Home / Self Care | Payer: Self-pay | Attending: Physician Assistant | Admitting: Physician Assistant

## 2021-06-02 DIAGNOSIS — L0291 Cutaneous abscess, unspecified: Secondary | ICD-10-CM

## 2021-06-02 DIAGNOSIS — M542 Cervicalgia: Secondary | ICD-10-CM

## 2021-06-02 MED ORDER — CYCLOBENZAPRINE HCL 10 MG PO TABS: 10.0000 mg | ORAL_TABLET | Freq: Two times a day (BID) | ORAL | 0 refills | Status: DC | PRN

## 2021-06-02 MED ORDER — DOXYCYCLINE HYCLATE 100 MG PO CAPS: 100.0000 mg | ORAL_CAPSULE | Freq: Two times a day (BID) | ORAL | 0 refills | Status: DC

## 2021-06-02 MED ORDER — NAPROXEN 500 MG PO TABS: 500.0000 mg | ORAL_TABLET | Freq: Two times a day (BID) | ORAL | 0 refills | Status: AC

## 2021-06-02 NOTE — ED Triage Notes (Signed)
Pt c/o abscess to rt upper thigh and rt lower leg, fatigue, and neck stiffness x2 days. 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

## 2021-06-02 NOTE — ED Provider Notes (Signed)
[facility]    CSN: [id number] Arrival date & time: 06/02/21  1341      History   Chief Complaint Chief Complaint  Patient presents with   Abscess    HPI Virginia Perez is a 40 y.o. female.   Patient here today for evaluation of an abscess to her right upper thigh that she noted about 2 days ago.  She states that she has also had smaller abscesses to her lower legs and fingers.  She has not had fever.  She denies any vomiting or diarrhea.  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  She also reports some neck stiffness this been ongoing for the last couple days as well.  She reports some pain with movement.  She states that at times will feel as if her neck is spasming. She has tried tylenol without significant relief.   The history is provided by the patient.  Abscess Associated symptoms: no fever, no nausea and no vomiting    History reviewed. No pertinent past medical history.  There are no problems to display for this patient.   Past Surgical History:  Procedure Laterality Date   CESAREAN SECTION     hemorrhoidectomy     TUBAL LIGATION  2012   WISDOM TOOTH EXTRACTION      OB History     Gravida  2   Para  2   Term      Preterm      AB      Living  2      SAB      IAB      Ectopic      Multiple      Live Births               Home Medications    Prior to Admission medications   Medication Sig Start Date End Date Taking? Authorizing Provider  cyclobenzaprine (FLEXERIL) 10 MG tablet Take 1 tablet (10 mg total) by mouth 2 (two) times daily as needed for muscle spasms. 06/02/21  Yes Francene Finders, PA-C  doxycycline (VIBRAMYCIN) 100 MG capsule Take 1 capsule (100 mg total) by mouth 2 (two) times daily. 06/02/21  Yes Francene Finders, PA-C  naproxen (NAPROSYN) 500 MG tablet Take 1 tablet (500 mg total) by mouth 2 (two) times daily. 06/02/21  Yes Francene Finders, PA-C    Family History Family History  Problem Relation Age of Onset   Hypertension Mother    Hypertension Father    Hypertension Maternal Grandmother     Social History Social History   Tobacco Use   Smoking status: Some Days    Packs/day: 0.25    Types: Cigarettes   Smokeless tobacco: Current  Vaping Use   Vaping Use: Never used  Substance Use Topics   Alcohol use: No   Drug use: No     Allergies   Prednisone   Review of Systems Review of Systems  Constitutional:  Negative for chills and fever.  Eyes:  Negative for discharge and redness.  Gastrointestinal:  Negative for abdominal pain, diarrhea, nausea and vomiting.  Genitourinary:  Positive for vaginal bleeding and vaginal discharge.  Skin:  Positive for color change and wound.    Physical Exam Triage Vital Signs ED Triage Vitals  Enc Vitals Group     BP 06/02/21 1547 (!) 171/88     Pulse  Rate 06/02/21 1547 71     Resp 06/02/21 1547 18     Temp 06/02/21 1547 97.9 F (36.6 C)     Temp Source 06/02/21 1547 Oral     SpO2 06/02/21 1547 95 %     Weight --      Height --      Head Circumference --      Peak Flow --      Pain Score 06/02/21 1548 5     Pain Loc --      Pain Edu? --      Excl. in [place]? --    No data found.  Updated Vital Signs BP (!) 171/88 (BP Location: Left Arm)   Pulse 71   Temp 97.9 F (36.6 C) (Oral)   Resp 18   SpO2 95%      Physical Exam Vitals and nursing note reviewed.  Constitutional:      General: She is not in acute distress.    Appearance: Normal appearance. She is not ill-appearing.  HENT:     Head: Normocephalic and atraumatic.  Eyes:     Conjunctiva/sclera: Conjunctivae normal.  Cardiovascular:     Rate and Rhythm: Normal rate.  Pulmonary:     Effort: Pulmonary effort is normal.  Musculoskeletal:     Comments: Full ROM of Neck with some mild pain at extremes reported  Skin:    Comments: Area of erythema with induration noted to right lateral  upper thigh, few wounds without active drainage or bleeding noted to lower leg, fingers  Neurological:     Mental Status: She is alert.  Psychiatric:        Mood and Affect: Mood normal.        Behavior: Behavior normal.        Thought Content: Thought content normal.     UC Treatments / Results  Labs (all labs ordered are listed, but only abnormal results are displayed) Labs Reviewed - No data to display  EKG   Radiology No results found.  Procedures Procedures (including critical care time)  Medications Ordered in UC Medications - No data to display  Initial Impression / Assessment and Plan / UC Course  I have reviewed the triage vital signs and the nursing notes.  Pertinent labs & imaging results that were available during my care of the patient were reviewed by me and considered in my medical decision making (see chart for details).   Doxycycline prescribed for abscess and recommended warm compresses to promote drainage.   Will treat neck pain with muscle relaxer and naproxen. Recommended caution with muscle relaxer as it may cause drowsiness. Encouraged follow up with any further concerns.    Final Clinical Impressions(s) / UC Diagnoses   Final diagnoses:  Abscess  Neck pain   Discharge Instructions   None    ED Prescriptions     Medication Sig Dispense Auth. Provider   doxycycline (VIBRAMYCIN) 100 MG capsule Take 1 capsule (100 mg total) by mouth 2 (two) times daily. 20 capsule Ewell Poe F, PA-C   cyclobenzaprine (FLEXERIL) 10 MG tablet Take 1 tablet (10 mg total) by mouth 2 (two) times daily as needed for muscle spasms. 20 tablet Ewell Poe F, PA-C   naproxen (NAPROSYN) 500 MG tablet Take 1 tablet (500 mg total) by mouth 2 (two) times daily. 30 tablet Francene Finders, PA-C      PDMP not reviewed this encounter.   Francene Finders, PA-C 06/02/21 1616

## 2021-07-01 ENCOUNTER — Other Ambulatory Visit: Payer: Self-pay | Admitting: Physician Assistant

## 2021-07-01 DIAGNOSIS — L2082 Flexural eczema: Secondary | ICD-10-CM

## 2021-08-30 ENCOUNTER — Other Ambulatory Visit: Payer: Self-pay

## 2021-08-30 ENCOUNTER — Encounter: Payer: Self-pay | Admitting: Emergency Medicine

## 2021-08-30 ENCOUNTER — Ambulatory Visit
Admission: EM | Admit: 2021-08-30 | Discharge: 2021-08-30 | Disposition: A | Discharge status: Home / Self Care | Payer: 59 | Attending: Physician Assistant | Admitting: Physician Assistant

## 2021-08-30 DIAGNOSIS — L0291 Cutaneous abscess, unspecified: Secondary | ICD-10-CM | POA: Diagnosis not present

## 2021-08-30 DIAGNOSIS — H60391 Other infective otitis externa, right ear: Secondary | ICD-10-CM | POA: Diagnosis not present

## 2021-08-30 MED ORDER — CIPROFLOXACIN-DEXAMETHASONE 0.3-0.1 % OT SUSP: 4.0000 [drp] | Freq: Two times a day (BID) | OTIC | 0 refills | Status: AC

## 2021-08-30 MED ORDER — DOXYCYCLINE HYCLATE 100 MG PO CAPS: 100.0000 mg | ORAL_CAPSULE | Freq: Two times a day (BID) | ORAL | 0 refills | Status: AC

## 2021-08-30 NOTE — ED Triage Notes (Addendum)
Pt here for right ear pain and swelling to side of face x 1 week with pain; pt sts some drainage from her ear

## 2021-08-30 NOTE — ED Provider Notes (Signed)
[facility]    CSN: [id number] Arrival date & time: 08/30/21  0802      History   Chief Complaint Chief Complaint  Patient presents with   Ear Pain    HPI Virginia Perez is a 41 y.o. female.   Patient here today for evaluation of right ear pain that started about a week ago. She also has a small area of swelling to her right lower face that she is concerned may be connected to ear symptoms. She has not had fever. She does not report treatment for symptoms.   She requests contact info for GYN.   The history is provided by the patient.   History reviewed. No pertinent past medical history.  There are no problems to display for this patient.   Past Surgical History:  Procedure Laterality Date   CESAREAN SECTION     hemorrhoidectomy     TUBAL LIGATION  2012   WISDOM TOOTH EXTRACTION      OB History     Gravida  2   Para  2   Term      Preterm      AB      Living  2      SAB      IAB      Ectopic      Multiple      Live Births               Home Medications    Prior to Admission medications   Medication Sig Start Date End Date Taking? Authorizing Provider  ciprofloxacin-dexamethasone (CIPRODEX) OTIC suspension Place 4 drops into the right ear 2 (two) times daily for 7 days. 08/30/21 09/06/21 Yes Francene Finders, PA-C  doxycycline (VIBRAMYCIN) 100 MG capsule Take 1 capsule (100 mg total) by mouth 2 (two) times daily. 08/30/21  Yes Francene Finders, PA-C  cyclobenzaprine (FLEXERIL) 10 MG tablet Take 1 tablet (10 mg total) by mouth 2 (two) times daily as needed for muscle spasms. 06/02/21   Francene Finders, PA-C  naproxen (NAPROSYN) 500 MG tablet Take 1 tablet (500 mg total) by mouth 2 (two) times daily. 06/02/21   Francene Finders, PA-C    Family History Family History  Problem Relation Age of Onset   Hypertension Mother    Hypertension Father    Hypertension Maternal Grandmother     Social History Social History    Tobacco Use   Smoking status: Some Days    Packs/day: 0.25    Types: Cigarettes   Smokeless tobacco: Current  Vaping Use   Vaping Use: Never used  Substance Use Topics   Alcohol use: No   Drug use: No     Allergies   Prednisone   Review of Systems Review of Systems  Constitutional:  Negative for chills and fever.  HENT:  Positive for ear discharge and ear pain. Negative for congestion.   Eyes:  Negative for discharge and redness.  Respiratory:  Negative for cough, shortness of breath and wheezing.   Gastrointestinal:  Negative for abdominal pain, diarrhea, nausea and vomiting.    Physical Exam Triage Vital Signs ED Triage Vitals  Enc Vitals Group     BP      Pulse      Resp      Temp      Temp src      SpO2      Weight      Height  Head Circumference      Peak Flow      Pain Score      Pain Loc      Pain Edu?      Excl. in [place]?    No data found.  Updated Vital Signs BP (!) 167/94 (BP Location: Right Arm)    Pulse 91    Temp 98.1 F (36.7 C) (Oral)    Resp 18    SpO2 100%      Physical Exam Vitals and nursing note reviewed.  Constitutional:      General: She is not in acute distress.    Appearance: Normal appearance. She is not ill-appearing.  HENT:     Head: Normocephalic and atraumatic.     Left Ear: Tympanic membrane normal.     Ears:     Comments: Right EAC significantly inflamed with drainage noted, tragal movement tenderness noted    Nose: No congestion.     Mouth/Throat:     Mouth: Mucous membranes are moist.     Pharynx: No oropharyngeal exudate or posterior oropharyngeal erythema.  Eyes:     Conjunctiva/sclera: Conjunctivae normal.  Cardiovascular:     Rate and Rhythm: Normal rate and regular rhythm.     Heart sounds: Normal heart sounds. No murmur heard. Pulmonary:     Effort: Pulmonary effort is normal. No respiratory distress.     Breath sounds: Normal breath sounds. No wheezing, rhonchi or rales.  Skin:    General: Skin is  warm and dry.     Comments: ~1cm area of swelling, erythema to right mandibular area  Neurological:     Mental Status: She is alert.  Psychiatric:        Mood and Affect: Mood normal.        Thought Content: Thought content normal.     UC Treatments / Results  Labs (all labs ordered are listed, but only abnormal results are displayed) Labs Reviewed - No data to display  EKG   Radiology No results found.  Procedures Procedures (including critical care time)  Medications Ordered in UC Medications - No data to display  Initial Impression / Assessment and Plan / UC Course  I have reviewed the triage vital signs and the nursing notes.  Pertinent labs & imaging results that were available during my care of the patient were reviewed by me and considered in my medical decision making (see chart for details).    Will treat to cover otitis externa as well as possible facial abscess. Recommended follow up if no improvement or if symptoms worsen in any way.   Final Clinical Impressions(s) / UC Diagnoses   Final diagnoses:  Infective otitis externa of right ear  Abscess   Discharge Instructions   None    ED Prescriptions     Medication Sig Dispense Auth. Provider   ciprofloxacin-dexamethasone (CIPRODEX) OTIC suspension Place 4 drops into the right ear 2 (two) times daily for 7 days. 7.5 mL Francene Finders, PA-C   doxycycline (VIBRAMYCIN) 100 MG capsule Take 1 capsule (100 mg total) by mouth 2 (two) times daily. 20 capsule Francene Finders, PA-C      PDMP not reviewed this encounter.   Francene Finders, PA-C 08/30/21 [phone]

## 2022-04-03 ENCOUNTER — Encounter: Payer: Self-pay | Admitting: Emergency Medicine

## 2022-04-03 ENCOUNTER — Ambulatory Visit
Admission: EM | Admit: 2022-04-03 | Discharge: 2022-04-03 | Disposition: A | Discharge status: Home / Self Care | Payer: 59 | Attending: Internal Medicine | Admitting: Internal Medicine

## 2022-04-03 DIAGNOSIS — H6983 Other specified disorders of Eustachian tube, bilateral: Secondary | ICD-10-CM | POA: Diagnosis not present

## 2022-04-03 DIAGNOSIS — M542 Cervicalgia: Secondary | ICD-10-CM | POA: Diagnosis not present

## 2022-04-03 DIAGNOSIS — R5383 Other fatigue: Secondary | ICD-10-CM

## 2022-04-03 DIAGNOSIS — R030 Elevated blood-pressure reading, without diagnosis of hypertension: Secondary | ICD-10-CM | POA: Diagnosis not present

## 2022-04-03 DIAGNOSIS — H6993 Unspecified Eustachian tube disorder, bilateral: Secondary | ICD-10-CM

## 2022-04-03 MED ORDER — CYCLOBENZAPRINE HCL 5 MG PO TABS: 5.0000 mg | ORAL_TABLET | Freq: Two times a day (BID) | ORAL | 0 refills | Status: AC | PRN

## 2022-04-03 MED ORDER — FLUTICASONE PROPIONATE 50 MCG/ACT NA SUSP: 1.0000 | Freq: Every day | NASAL | 0 refills | Status: AC

## 2022-04-03 MED ORDER — AMLODIPINE BESYLATE 5 MG PO TABS: 5.0000 mg | ORAL_TABLET | Freq: Every day | ORAL | 0 refills | Status: AC

## 2022-04-03 NOTE — Discharge Instructions (Signed)
Your neck pain/stiffness appears to be muscular strain.  A muscle relaxer has been prescribed for you to take as needed.  Please be advised that this can cause drowsiness so do not drive, operate machinery, drink alcohol with taking this medication.  Follow-up with orthopedist at provided contact information if symptoms persist or worsen.  You have been prescribed a blood pressure medication to take daily.  Please check your blood pressure at home twice daily and write it down.  Follow-up with primary care doctor for further evaluation and management.  Blood work is also pending.  You have fluid behind your eardrum which is being treated with Zyrtec and Flonase.  You have been prescribed Flonase and I recommend that you continue your Zyrtec.

## 2022-04-03 NOTE — ED Triage Notes (Signed)
Pt is present today with neck pain, fatigue, and bilateral ear fullness. Pt sx started x2 weeks ago

## 2022-04-03 NOTE — ED Provider Notes (Signed)
[facility]    CSN: [id number] Arrival date & time: 04/03/22  0818      History   Chief Complaint Chief Complaint  Patient presents with   Neck Pain   Ear Fullness   Fatigue    HPI Virginia Perez is a 41 y.o. female.   Patient presents today for several different chief complaints.  Patient is reporting neck pain/stiffness that started "a while back".  Patient was seen previously at urgent care in 05/2021 and prescribed muscle relaxer with improvement in symptoms.  Denies any obvious injury.  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  Patient has taken ibuprofen with minimal improvement.  Symptoms flared up about 2 weeks ago.  Patient also reports that she has had a feeling of ear fullness and discomfort in both ears for about 2 weeks.  Denies drainage, trauma, foreign body to the ears.  Denies any associated upper respiratory symptoms or fever.  Patient also reporting some generalized fatigue that has been present for about 2 weeks.  Denies chest pain, shortness of breath, headache, acute upper respiratory infection symptoms, cough, dizziness, blurred vision, nausea, vomiting, diarrhea, abdominal pain.  Denies any recent blood in stool.  Denies any associated fever or sick contacts.  Patient also has elevated blood pressure reading.  She states that she does not check her blood pressure at home.  Denies any confirmed diagnosis of hypertension and does not take any daily hypertension medications.  Patient reports that she does not take any daily medications at all.  Patient is attributing her fatigue and high blood pressure to her disobedient kids as she has been having a lot of stress related to this lately.   Neck Pain Ear Fullness    History reviewed. No pertinent past medical history.  There are no problems to display for this patient.   Past Surgical History:  Procedure Laterality Date   CESAREAN  SECTION     hemorrhoidectomy     TUBAL LIGATION  2012   WISDOM TOOTH EXTRACTION      OB History     Gravida  2   Para  2   Term      Preterm      AB      Living  2      SAB      IAB      Ectopic      Multiple      Live Births               Home Medications    Prior to Admission medications   Medication Sig Start Date End Date Taking? Authorizing Provider  amLODipine (NORVASC) 5 MG tablet Take 1 tablet (5 mg total) by mouth daily. 04/03/22  Yes Alastor Kneale, Hildred Alamin E, FNP  fluticasone (FLONASE) 50 MCG/ACT nasal spray Place 1 spray into both nostrils daily for 3 days. 04/03/22 04/06/22 Yes Adalie Mand, Michele Rockers, FNP  cyclobenzaprine (FLEXERIL) 5 MG tablet Take 1 tablet (5 mg total) by mouth 2 (two) times daily as needed for muscle spasms. 04/03/22   Teodora Medici, FNP  doxycycline (VIBRAMYCIN) 100 MG capsule Take 1 capsule (100 mg total) by mouth 2 (two) times daily. 08/30/21   Francene Finders, PA-C  naproxen (NAPROSYN) 500 MG tablet Take 1 tablet (500 mg total) by mouth 2 (two) times daily. 06/02/21   Francene Finders, PA-C    Family History Family History  Problem Relation Age of Onset   Hypertension Mother    Hypertension Father    Hypertension Maternal Grandmother     Social History Social History   Tobacco Use   Smoking status: Some Days    Packs/day: 0.25    Types: Cigarettes   Smokeless tobacco: Current  Vaping Use   Vaping Use: Never used  Substance Use Topics   Alcohol use: No   Drug use: No     Allergies   Prednisone   Review of Systems Review of Systems Per HPI  Physical Exam Triage Vital Signs ED Triage Vitals [04/03/22 0840]  Enc Vitals Group     BP (!) 172/85     Pulse Rate 75     Resp 18     Temp 98.1 F (36.7 C)     Temp src      SpO2 98 %     Weight      Height      Head Circumference      Peak Flow      Pain Score 6     Pain Loc      Pain Edu?      Excl. in [place]?    No data found.  Updated Vital Signs BP (!) 171/101    Pulse 75   Temp 98.1 F (36.7 C)   Resp 18   SpO2 98%   Visual Acuity Right Eye Distance:   Left Eye Distance:   Bilateral Distance:    Right Eye Near:   Left Eye Near:    Bilateral Near:     Physical Exam Constitutional:      General: She is not in acute distress.    Appearance: Normal appearance. She is not toxic-appearing or diaphoretic.  HENT:     Head: Normocephalic and atraumatic.     Right Ear: Ear canal and external ear normal. No drainage, swelling or tenderness. A middle ear effusion is present. There is no impacted cerumen. No foreign body. No mastoid tenderness. Tympanic membrane is not perforated, erythematous or bulging.     Left Ear: Ear canal and external ear normal. No drainage, swelling or tenderness. A middle ear effusion is present. There is no impacted cerumen. No foreign body. No mastoid tenderness. Tympanic membrane is not perforated, erythematous or bulging.  Eyes:     Extraocular Movements: Extraocular movements intact.     Conjunctiva/sclera: Conjunctivae normal.     Pupils: Pupils are equal, round, and reactive to light.  Neck:     Comments: Tenderness to palpation to left upper paraspinal muscles.  No direct spinal tenderness, crepitus, step-off.  Patient has full range of motion of neck.  No swelling, discoloration, warmth, lacerations, abrasions noted. Cardiovascular:     Rate and Rhythm: Normal rate and regular rhythm.     Pulses: Normal pulses.     Heart sounds: Normal heart sounds.  Pulmonary:     Effort: Pulmonary effort is normal. No respiratory distress.     Breath sounds: Normal breath sounds.  Abdominal:     General: Bowel sounds are normal. There is no distension.     Palpations: Abdomen is soft.     Tenderness: There is no abdominal tenderness.  Musculoskeletal:     Cervical back: Normal range of motion. No edema, erythema or crepitus. Muscular tenderness present. No pain with movement or spinous process tenderness.  Neurological:      General: No focal deficit present.     Mental Status: She is alert  and oriented to person, place, and time. Mental status is at baseline.     Cranial Nerves: Cranial nerves 2-12 are intact.     Sensory: Sensation is intact.     Motor: Motor function is intact.     Coordination: Coordination is intact.     Gait: Gait is intact.  Psychiatric:        Mood and Affect: Mood normal.        Behavior: Behavior normal.        Thought Content: Thought content normal.        Judgment: Judgment normal.      UC Treatments / Results  Labs (all labs ordered are listed, but only abnormal results are displayed) Labs Reviewed  COMPREHENSIVE METABOLIC PANEL  CBC    EKG   Radiology No results found.  Procedures Procedures (including critical care time)  Medications Ordered in UC Medications - No data to display  Initial Impression / Assessment and Plan / UC Course  I have reviewed the triage vital signs and the nursing notes.  Pertinent labs & imaging results that were available during my care of the patient were reviewed by me and considered in my medical decision making (see chart for details).     Patient's ear discomfort is due to bilateral middle ear effusion.  Patient already has Zyrtec at home so encouraged patient to take this.  Will add Flonase.  No signs of infection at this time.  Will avoid Sudafed given patient's blood pressure.  Patient's neck pain/stiffness appears to be muscular in nature.  Do not think imaging is necessary given no obvious injury and chronicity of symptoms.  Will treat with muscle relaxer given patient has had resolution of symptoms in the past.  Advised patient muscle relaxer can cause drowsiness.  Patient also has generalized fatigue and elevated blood pressure reading.  Patient is attributing fatigue and elevated blood pressure reading to recent stress related to her children.  Patient is tearful in exam room.  After further review of patient's chart,  patient has had elevated blood pressure readings at previous urgent care visits so will opt to start amlodipine blood pressure medication.  Advised patient to monitor her blood pressure very closely at home with her home blood pressure cuff.  Will obtain CMP and CBC for any further evaluation at this time.  Vital signs and patient's physical exam is completely stable so do not think that emergent evaluation is necessary.  Although, patient was given strict return and ER precautions for all chief complaints today.  Patient also advised to follow-up with PCP for further evaluation and management.  Patient stated that she would walk next-door to primary care to schedule an appointment today with PCP.  Patient verbalized understanding and was agreeable with plan. Final Clinical Impressions(s) / UC Diagnoses   Final diagnoses:  Neck pain  Dysfunction of both eustachian tubes  Other fatigue  Elevated blood pressure reading     Discharge Instructions      Your neck pain/stiffness appears to be muscular strain.  A muscle relaxer has been prescribed for you to take as needed.  Please be advised that this can cause drowsiness so do not drive, operate machinery, drink alcohol with taking this medication.  Follow-up with orthopedist at provided contact information if symptoms persist or worsen.  You have been prescribed a blood pressure medication to take daily.  Please check your blood pressure at home twice daily and write it down.  Follow-up with primary  care doctor for further evaluation and management.  Blood work is also pending.  You have fluid behind your eardrum which is being treated with Zyrtec and Flonase.  You have been prescribed Flonase and I recommend that you continue your Zyrtec.    ED Prescriptions     Medication Sig Dispense Auth. Provider   cyclobenzaprine (FLEXERIL) 5 MG tablet Take 1 tablet (5 mg total) by mouth 2 (two) times daily as needed for muscle spasms. 20 tablet [place],  [place] E, [place]   amLODipine (NORVASC) 5 MG tablet Take 1 tablet (5 mg total) by mouth daily. 60 tablet [place], [place] E, [place]   fluticasone [facility]) 50 MCG/ACT nasal spray Place 1 spray into both nostrils daily for 3 days. 16 g Teodora Medici, [place]      PDMP not reviewed this encounter.   Teodora Medici, [place] 04/03/22 [phone]

## 2022-04-04 LAB — CBC
Hematocrit: 34.7 % (ref 34.0–46.6)
Hemoglobin: 11.8 g/dL (ref 11.1–15.9)
MCH: 32.5 pg (ref 26.6–33.0)
MCHC: 34 g/dL (ref 31.5–35.7)
MCV: 96 fL (ref 79–97)
Platelets: 235 10*3/uL (ref 150–450)
RBC: 3.63 x10E6/uL — ABNORMAL LOW (ref 3.77–5.28)
RDW: 12.5 % (ref 11.7–15.4)
WBC: 4 10*3/uL (ref 3.4–10.8)

## 2022-04-04 LAB — COMPREHENSIVE METABOLIC PANEL
ALT: 14 IU/L (ref 0–32)
AST: 22 IU/L (ref 0–40)
Albumin/Globulin Ratio: 1.6 (ref 1.2–2.2)
Albumin: 4.6 g/dL (ref 3.9–4.9)
Alkaline Phosphatase: 72 IU/L (ref 44–121)
BUN/Creatinine Ratio: 13 (ref 9–23)
BUN: 9 mg/dL (ref 6–24)
Bilirubin Total: 0.3 mg/dL (ref 0.0–1.2)
CO2: 23 mmol/L (ref 20–29)
Calcium: 9.8 mg/dL (ref 8.7–10.2)
Chloride: 102 mmol/L (ref 96–106)
Creatinine, Ser: 0.71 mg/dL (ref 0.57–1.00)
Globulin, Total: 2.8 g/dL (ref 1.5–4.5)
Glucose: 87 mg/dL (ref 70–99)
Potassium: 4.5 mmol/L (ref 3.5–5.2)
Sodium: 140 mmol/L (ref 134–144)
Total Protein: 7.4 g/dL (ref 6.0–8.5)
eGFR: 108 mL/min/{1.73_m2} (ref >59)

## 2024-02-22 ENCOUNTER — Other Ambulatory Visit: Payer: Self-pay

## 2024-02-22 ENCOUNTER — Emergency Department (HOSPITAL_COMMUNITY)
Admission: EM | Admit: 2024-02-22 | Discharge: 2024-02-22 | Disposition: A | Discharge status: Home / Self Care | Payer: Self-pay | Attending: Emergency Medicine | Admitting: Emergency Medicine

## 2024-02-22 DIAGNOSIS — K0889 Other specified disorders of teeth and supporting structures: Secondary | ICD-10-CM

## 2024-02-22 DIAGNOSIS — K029 Dental caries, unspecified: Secondary | ICD-10-CM | POA: Insufficient documentation

## 2024-02-22 MED ORDER — ACETAMINOPHEN 325 MG PO TABS
650.0000 mg | ORAL_TABLET | Freq: Four times a day (QID) | ORAL | Status: AC | PRN
  Administered 2024-02-22: 650 mg via ORAL
  Filled 2024-02-22: qty 2

## 2024-02-22 MED ORDER — AMOXICILLIN-POT CLAVULANATE 875-125 MG PO TABS
1.0000 | ORAL_TABLET | Freq: Once | ORAL | Status: AC
  Administered 2024-02-22: 1 via ORAL
  Filled 2024-02-22: qty 1

## 2024-02-22 MED ORDER — AMOXICILLIN-POT CLAVULANATE 875-125 MG PO TABS: 1.0000 | ORAL_TABLET | Freq: Two times a day (BID) | ORAL | 0 refills | Status: AC

## 2024-02-22 NOTE — ED Triage Notes (Signed)
 Pt here with R lower dental pain onset several days ago.

## 2024-02-22 NOTE — Discharge Instructions (Addendum)
 Return for any problem.  ?

## 2024-02-22 NOTE — ED Notes (Signed)
 Patient Alert and oriented to baseline. Stable and ambulatory to baseline. Patient verbalized understanding of the discharge instructions.  Patient belongings were taken by the patient.

## 2024-02-22 NOTE — ED Provider Notes (Signed)
 [place] EMER[facility]ENCY DEPARTMENT AT [facility] Provider Note   CSN: [id number] Arrival date & time: 02/22/24  9251     Patient presents with: Dental Pain   Virginia Perez is a 43 y.o. female.   43 year old female with prior medical history as detailed below presents for dental pain.  She has a longstanding history of dental decay.  She reports gradually increasing pain to the teeth of the right lower jaw.  She denies fever.  She denies other complaint.  She is requesting antibiotics.  She understands that she needs to find a dentist.  She does not currently have a dentist.  The history is provided by the patient and medical records.       Prior to Admission medications   Medication Sig Start Date End Date Taking? Authorizing Provider  amoxicillin -clavulanate (AUGMENTIN ) 875-125 MG tablet Take 1 tablet by mouth every 12 (twelve) hours. 02/22/24  Yes Laurice Maude BROCKS, MD  amLODipine  (NORVASC ) 5 MG tablet Take 1 tablet (5 mg total) by mouth daily. 04/03/22   Hazen Darryle BRAVO, FNP  cyclobenzaprine  (FLEXERIL ) 5 MG tablet Take 1 tablet (5 mg total) by mouth 2 (two) times daily as needed for muscle spasms. 04/03/22   Hazen Darryle BRAVO, FNP  doxycycline  (VIBRAMYCIN ) 100 MG capsule Take 1 capsule (100 mg total) by mouth 2 (two) times daily. 08/30/21   Billy Asberry FALCON, PA-C  fluticasone  (FLONASE ) 50 MCG/ACT nasal spray Place 1 spray into both nostrils daily for 3 days. 04/03/22 04/06/22  Hazen Darryle BRAVO, FNP  naproxen  (NAPROSYN ) 500 MG tablet Take 1 tablet (500 mg total) by mouth 2 (two) times daily. 06/02/21   Billy Asberry FALCON, PA-C    Allergies: Prednisone    Review of Systems  All other systems reviewed and are negative.   Updated Vital Signs BP (!) 178/93 (BP Location: Right Arm)   Pulse 89   Temp 98.8 F (37.1 C) (Oral)   Resp 19   SpO2 98%   Physical Exam Vitals and nursing note reviewed.  Constitutional:      General: She is not in acute distress.    Appearance: Normal  appearance. She is well-developed.  HENT:     Head: Normocephalic and atraumatic.     Mouth/Throat:     Comments: Diffuse and advanced dental caries.  No discrete dental abscess noted. Eyes:     Conjunctiva/sclera: Conjunctivae normal.     Pupils: Pupils are equal, round, and reactive to light.  Cardiovascular:     Rate and Rhythm: Normal rate and regular rhythm.     Heart sounds: Normal heart sounds.  Pulmonary:     Effort: Pulmonary effort is normal. No respiratory distress.     Breath sounds: Normal breath sounds.  Abdominal:     General: There is no distension.     Palpations: Abdomen is soft.     Tenderness: There is no abdominal tenderness.  Musculoskeletal:        General: No deformity. Normal range of motion.     Cervical back: Normal range of motion and neck supple.  Skin:    General: Skin is warm and dry.  Neurological:     General: No focal deficit present.     Mental Status: She is alert and oriented to person, place, and time.     (all labs ordered are listed, but only abnormal results are displayed) Labs Reviewed - No data to display  EKG: None  Radiology: No results found.  Procedures   Medications Ordered in the ED  acetaminophen  (TYLENOL ) tablet 650 mg (has no administration in time range)  amoxicillin -clavulanate (AUGMENTIN ) 875-125 MG per tablet 1 tablet (has no administration in time range)                                    Medical Decision Making Patient is presenting with dental pain and likely dental infection given her advanced dental caries and overall poor dental health.  Patient without signs or symptoms of other significant acute pathology.  Patient would benefit from course of antibiotics to treat suspected dental infection.    Patient understands need for close outpatient follow-up with dentistry.  Strict return precautions given and understood.  Importance of close follow-up stressed.  Risk OTC drugs. Prescription drug  management.        Final diagnoses:  Pain, dental    ED Discharge Orders          Ordered    amoxicillin -clavulanate (AUGMENTIN ) 875-125 MG tablet  Every 12 hours        02/22/24 0817               Laurice Maude BROCKS, MD 02/22/24 [phone]
